# Patient Record
Sex: Female | Born: 1975
Health system: Southern US, Community
[De-identification: ages and names within clinical notes are randomized; demographics above are authoritative.]

## PROBLEM LIST (undated history)

## (undated) DIAGNOSIS — G43909 Migraine, unspecified, not intractable, without status migrainosus: Secondary | ICD-10-CM

## (undated) DIAGNOSIS — E079 Disorder of thyroid, unspecified: Secondary | ICD-10-CM

## (undated) DIAGNOSIS — K219 Gastro-esophageal reflux disease without esophagitis: Secondary | ICD-10-CM

## (undated) DIAGNOSIS — E039 Hypothyroidism, unspecified: Secondary | ICD-10-CM

## (undated) HISTORY — PX: EYE SURGERY: SHX253

## (undated) HISTORY — DX: Disorder of thyroid, unspecified: E07.9

## (undated) HISTORY — PX: BREAST ENHANCEMENT SURGERY: SHX7

## (undated) HISTORY — DX: Migraine, unspecified, not intractable, without status migrainosus: G43.909

## (undated) HISTORY — PX: OTHER SURGICAL HISTORY: SHX169

---

## 2012-09-07 ENCOUNTER — Telehealth: Payer: Self-pay | Admitting: Nurse Practitioner

## 2012-09-07 ENCOUNTER — Telehealth: Payer: Self-pay | Admitting: *Deleted

## 2012-09-07 NOTE — Telephone Encounter (Signed)
PT AWARE that we have no available appts.

## 2012-09-07 NOTE — Telephone Encounter (Signed)
Daughter and husband diagnosed with flu this week.  They are taking Tamiflu.   Patient developed a scratchy throat this morning which is normal for her this time of year.  Denies cough, fever, bodyaches, or congestion. She questioned whether an appt and flu testing were appropriate.   Offered that sometimes we call in Tamiflu prophilactically for close contacts of those diagnosed with the flu.  She doesn't want to take it profphilactically though. Suggested she take an OTC antihistamine and see if her symptoms resolve.  Explained that normally the flu presents suddenly with a cough, bodyaches, and congestion.  She was agreeable to this and will let us know if other symptoms develop.

## 2012-09-08 ENCOUNTER — Encounter: Payer: Self-pay | Admitting: Nurse Practitioner

## 2012-09-08 ENCOUNTER — Ambulatory Visit (INDEPENDENT_AMBULATORY_CARE_PROVIDER_SITE_OTHER): Payer: BC Managed Care – PPO | Admitting: Nurse Practitioner

## 2012-09-08 VITALS — BP 112/70 | HR 96 | Temp 99.9°F | Ht 65.0 in | Wt 153.0 lb

## 2012-09-08 DIAGNOSIS — J069 Acute upper respiratory infection, unspecified: Secondary | ICD-10-CM

## 2012-09-08 DIAGNOSIS — J029 Acute pharyngitis, unspecified: Secondary | ICD-10-CM

## 2012-09-08 DIAGNOSIS — R52 Pain, unspecified: Secondary | ICD-10-CM

## 2012-09-08 LAB — POCT INFLUENZA A/B: Influenza B, POC: NEGATIVE

## 2012-09-08 MED ORDER — HYDROCODONE-HOMATROPINE 5-1.5 MG/5ML PO SYRP: 5.0000 mL | ORAL_SOLUTION | Freq: Three times a day (TID) | ORAL | Status: DC | PRN

## 2012-09-08 NOTE — Patient Instructions (Signed)
Force fluids °Motrin or tylenol OTC °OTC decongestant °Throat lozenges if help °New toothbrush in 3 days ° °

## 2012-09-08 NOTE — Addendum Note (Signed)
Addended by: Bennie Pierini on: 09/08/2012 11:15 AM   Modules accepted: Orders

## 2012-09-08 NOTE — Telephone Encounter (Signed)
appt made

## 2012-09-08 NOTE — Progress Notes (Signed)
  Subjective:    Patient ID: Kimberly Shaw, female    DOB: 1975-12-17, 37 y.o.   MRN: [id number]  HPI-Patient in complaining of Sore throat . Started 1day. Has gotten worse since started. Associated symptoms include slight cough during the night. He has tried nothing OTC. Daughter diagnosed with flu earlier this week.     Review of Systems  Constitutional: Negative.   HENT: Positive for congestion, sore throat, rhinorrhea and sinus pressure. Negative for ear pain.   Eyes: Negative.   Respiratory: Positive for cough (nonproductive).   Cardiovascular: Negative.   Gastrointestinal: Negative.   Genitourinary: Negative.   Musculoskeletal: Negative.   Skin: Negative.   Hematological: Negative.   Psychiatric/Behavioral: Negative.        Objective:   Physical Exam  Constitutional: She appears well-developed and well-nourished.  HENT:  Right Ear: Tympanic membrane is not injected and not erythematous.  Left Ear: Tympanic membrane is not injected and not erythematous.  Nose: Mucosal edema and rhinorrhea present.  Mouth/Throat: Uvula is midline and mucous membranes are normal. Posterior oropharyngeal erythema present. No oropharyngeal exudate or posterior oropharyngeal edema.  Neck: Normal range of motion. Neck supple.  Cardiovascular: Normal rate and normal heart sounds.   Pulmonary/Chest: Effort normal and breath sounds normal.  Abdominal: Soft. Bowel sounds are normal.  Lymphadenopathy:    She has no cervical adenopathy.  Skin: Skin is warm and dry.  Psychiatric: She has a normal mood and affect. Her behavior is normal.    BP 112/70  Pulse 96  Temp(Src) 99.9 F (37.7 C) (Oral)  Ht 5\' 5"  (1.651 m)  Wt 153 lb (69.4 kg)  BMI 25.46 kg/m2  LMP 08/18/2012 Results for orders placed in visit on 09/08/12  POCT RAPID STREP A (OFFICE)      Result Value Range   Rapid Strep A Screen Negative  Negative  POCT INFLUENZA A/B      Result Value Range   Influenza A, POC Negative     Influenza B, POC Negative          Assessment & Plan:  URI/pharyngitis  Force fluids Motrin or tylenol OTC OTC decongestant Throat lozenges if help New toothbrush in 3 days Mary-Margaret Daphine Deutscher, FNP

## 2012-09-12 ENCOUNTER — Telehealth: Payer: Self-pay | Admitting: Nurse Practitioner

## 2012-09-12 NOTE — Telephone Encounter (Signed)
Please advise 

## 2012-09-12 NOTE — Telephone Encounter (Signed)
Patient states that she is still very sick. Seen last week and DWM gave abt this weekend. Please call

## 2012-09-12 NOTE — Telephone Encounter (Signed)
I called in amoxicillin for the weekend She probably needs to be seen again by Gennette Pac

## 2012-09-13 NOTE — Telephone Encounter (Signed)
Continues to have cough and congestion.  She is taking Amoxicillin, sudafed, and a prescription cough med at night.  She was taking Tylenol as well but has d/c it.  She feels a little better since cutting back on the OTC meds.    She will let us know if her symptoms persist or worsen.

## 2013-01-17 ENCOUNTER — Encounter: Payer: Self-pay | Admitting: General Practice

## 2013-01-17 ENCOUNTER — Ambulatory Visit (INDEPENDENT_AMBULATORY_CARE_PROVIDER_SITE_OTHER): Payer: BC Managed Care – PPO | Admitting: General Practice

## 2013-01-17 VITALS — BP 97/66 | HR 69 | Temp 98.5°F | Ht 65.0 in | Wt 152.0 lb

## 2013-01-17 DIAGNOSIS — L239 Allergic contact dermatitis, unspecified cause: Secondary | ICD-10-CM

## 2013-01-17 DIAGNOSIS — L259 Unspecified contact dermatitis, unspecified cause: Secondary | ICD-10-CM

## 2013-01-17 MED ORDER — TRIAMCINOLONE ACETONIDE 0.1 % EX CREA: TOPICAL_CREAM | Freq: Two times a day (BID) | CUTANEOUS | Status: DC

## 2013-01-17 MED ORDER — PREDNISONE (PAK) 10 MG PO TABS: ORAL_TABLET | ORAL | Status: DC

## 2013-01-17 MED ORDER — METHYLPREDNISOLONE ACETATE 80 MG/ML IJ SUSP
80.0000 mg | Freq: Once | INTRAMUSCULAR | Status: AC
  Administered 2013-01-17: 80 mg via INTRAMUSCULAR

## 2013-01-17 NOTE — Progress Notes (Signed)
  Subjective:    Patient ID: Kimberly Shaw, female    DOB: [birth year], 37 y.o.   MRN: [id number]  Rash This is a new problem. The current episode started 1 to 4 weeks ago. The problem has been gradually worsening since onset. The affected locations include the torso, left upper leg and right upper leg. The rash is characterized by redness. She was exposed to nothing. Pertinent negatives include no fever or shortness of breath. Past treatments include antihistamine. There is no history of allergies.      Review of Systems  Constitutional: Negative for fever and chills.  Respiratory: Negative for chest tightness and shortness of breath.   Cardiovascular: Negative for chest pain and palpitations.  Skin: Positive for rash.       Red circular rash to torso, upper legs, and arms  Neurological: Negative for dizziness, weakness and headaches.       Objective:   Physical Exam  Constitutional: She is oriented to person, place, and time. She appears well-developed and well-nourished.  Cardiovascular: Normal rate, regular rhythm and normal heart sounds.   Pulmonary/Chest: Effort normal and breath sounds normal. No respiratory distress. She exhibits no tenderness.  Neurological: She is alert and oriented to person, place, and time.  Skin: Skin is warm and dry. Rash noted. There is erythema.  Maculopapular, erythematous patchy round rash noted to torso, upper legs, and arms.   Psychiatric: She has a normal mood and affect.          Assessment & Plan:  1. Allergic dermatitis - methylPREDNISolone acetate (DEPO-MEDROL) injection 80 mg; Inject 1 mL (80 mg total) into the muscle once. - predniSONE (STERAPRED UNI-PAK) 10 MG tablet; Take as directed  Dispense: 21 tablet; Refill: 0 - triamcinolone cream (KENALOG) 0.1 %; Apply topically 2 (two) times daily.  Dispense: 30 g; Refill: 0 -avoid allergens -keep skin clean and dry -RTO if symptoms worsen or unresolved -Patient verbalized  understanding -Coralie Keens, FNP-C

## 2013-01-17 NOTE — Patient Instructions (Signed)

## 2013-03-30 ENCOUNTER — Other Ambulatory Visit: Payer: Self-pay

## 2014-07-17 ENCOUNTER — Encounter: Payer: Self-pay | Admitting: Nurse Practitioner

## 2014-07-17 ENCOUNTER — Ambulatory Visit (INDEPENDENT_AMBULATORY_CARE_PROVIDER_SITE_OTHER): Payer: BLUE CROSS/BLUE SHIELD | Admitting: Nurse Practitioner

## 2014-07-17 VITALS — BP 104/68 | HR 65 | Temp 97.3°F | Ht 65.0 in | Wt 152.6 lb

## 2014-07-17 DIAGNOSIS — J209 Acute bronchitis, unspecified: Secondary | ICD-10-CM

## 2014-07-17 DIAGNOSIS — J0100 Acute maxillary sinusitis, unspecified: Secondary | ICD-10-CM

## 2014-07-17 MED ORDER — AZITHROMYCIN 250 MG PO TABS: ORAL_TABLET | ORAL | Status: DC

## 2014-07-17 MED ORDER — BENZONATATE 100 MG PO CAPS: 100.0000 mg | ORAL_CAPSULE | Freq: Three times a day (TID) | ORAL | Status: DC | PRN

## 2014-07-17 NOTE — Patient Instructions (Signed)

## 2014-07-17 NOTE — Progress Notes (Signed)
  Subjective:     Kimberly Shaw is a 39 y.o. female who presents for evaluation of sinus pain. Symptoms include: congestion, cough, facial pain, foul breath, headaches, nasal congestion, post nasal drip and sinus pressure. Onset of symptoms was 3 days ago. Symptoms have been unchanged since that time. Past history is significant for no history of pneumonia or bronchitis. Patient is a non-smoker.  The following portions of the patient's history were reviewed and updated as appropriate: allergies, current medications, past family history, past medical history, past social history, past surgical history and problem list.  Review of Systems Pertinent items are noted in HPI.   Objective:    BP 104/68 mmHg  Pulse 65  Temp(Src) 97.3 F (36.3 C) (Oral)  Ht 5\' 5"  (1.651 m)  Wt 152 lb 9.6 oz (69.219 kg)  BMI 25.39 kg/m2  LMP 07/15/2014 General appearance: alert and cooperative Head: Normocephalic, without obvious abnormality, atraumatic Eyes: conjunctivae/corneas clear. PERRL, EOM's intact. Fundi benign. Ears: normal TM's and external ear canals both ears Nose: Nares normal. Septum midline. Mucosa normal. No drainage or sinus tenderness., clear discharge, moderate congestion, sinus tenderness bilateral Throat: lips, mucosa, and tongue normal; teeth and gums normal Neck: no adenopathy, no carotid bruit, no JVD, supple, symmetrical, trachea midline and thyroid not enlarged, symmetric, no tenderness/mass/nodules Lungs: normal percussion bilaterally and dry hacky cough Heart: regular rate and rhythm, S1, S2 normal, no murmur, click, rub or gallop    Assessment:    Acute bacterial sinusitis.    Bronchitis    Plan:   1. Take meds as prescribed 2. Use a cool mist humidifier especially during the winter months and when heat has been humid. 3. Use saline nose sprays frequently 4. Saline irrigations of the nose can be very helpful if done frequently.  * 4X daily for 1 week*  * Use of a nettie  pot can be helpful with this. Follow directions with this* 5. Drink plenty of fluids 6. Keep thermostat turn down low 7.For any cough or congestion  Use plain Mucinex- regular strength or max strength is fine   * Children- consult with Pharmacist for dosing 8. For fever or aces or pains- take tylenol or ibuprofen appropriate for age and weight.  * for fevers greater than 101 orally you may alternate ibuprofen and tylenol every  3 hours.    Meds ordered this encounter  Medications  . cyanocobalamin 500 MCG tablet    Sig: Take 500 mcg by mouth daily.  Marland Kitchen. azithromycin (ZITHROMAX Z-PAK) 250 MG tablet    Sig: As directed    Dispense:  1 each    Refill:  0    Order Specific Question:  Supervising Provider    Answer:  Ernestina PennaMOORE, DONALD W [[id number]]  . benzonatate (TESSALON PERLES) 100 MG capsule    Sig: Take 1 capsule (100 mg total) by mouth 3 (three) times daily as needed for cough.    Dispense:  20 capsule    Refill:  0    Order Specific Question:  Supervising Provider    Answer:  Ernestina PennaMOORE, DONALD W [[id number]]    Mary-Margaret Daphine DeutscherMartin, FNP

## 2014-07-18 ENCOUNTER — Telehealth: Payer: Self-pay | Admitting: Nurse Practitioner

## 2014-07-18 MED ORDER — HYDROCODONE-HOMATROPINE 5-1.5 MG/5ML PO SYRP: 5.0000 mL | ORAL_SOLUTION | Freq: Four times a day (QID) | ORAL | Status: DC | PRN

## 2014-07-18 NOTE — Telephone Encounter (Signed)
Hycodan rx ready for pick up  

## 2014-07-18 NOTE — Telephone Encounter (Signed)
Patient aware rx ready and at the front desk

## 2014-10-17 ENCOUNTER — Telehealth: Payer: Self-pay | Admitting: Nurse Practitioner

## 2014-10-17 NOTE — Telephone Encounter (Signed)
Appointment given for Friday at 11:25 with Jannifer Rodneyhristy Hawks, FNP.

## 2014-10-19 ENCOUNTER — Encounter: Payer: Self-pay | Admitting: Family

## 2014-10-19 ENCOUNTER — Ambulatory Visit (INDEPENDENT_AMBULATORY_CARE_PROVIDER_SITE_OTHER): Payer: BLUE CROSS/BLUE SHIELD | Admitting: Family

## 2014-10-19 VITALS — BP 101/74 | HR 58 | Temp 97.1°F | Ht 65.0 in | Wt 153.0 lb

## 2014-10-19 DIAGNOSIS — M5432 Sciatica, left side: Secondary | ICD-10-CM | POA: Diagnosis not present

## 2014-10-19 MED ORDER — MELOXICAM 15 MG PO TABS: 15.0000 mg | ORAL_TABLET | Freq: Every day | ORAL | Status: DC

## 2014-10-19 MED ORDER — METHYLPREDNISOLONE ACETATE 80 MG/ML IJ SUSP
80.0000 mg | Freq: Once | INTRAMUSCULAR | Status: AC
  Administered 2014-10-19: 80 mg via INTRAMUSCULAR

## 2014-10-19 MED ORDER — CYCLOBENZAPRINE HCL 10 MG PO TABS: 10.0000 mg | ORAL_TABLET | Freq: Three times a day (TID) | ORAL | Status: DC | PRN

## 2014-10-19 MED ORDER — KETOROLAC TROMETHAMINE 60 MG/2ML IM SOLN
60.0000 mg | Freq: Once | INTRAMUSCULAR | Status: AC
  Administered 2014-10-19: 60 mg via INTRAMUSCULAR

## 2014-10-19 NOTE — Patient Instructions (Signed)
Sciatica Sciatica is pain, weakness, numbness, or tingling along the path of the sciatic nerve. The nerve starts in the lower back and runs down the back of each leg. The nerve controls the muscles in the lower leg and in the back of the knee, while also providing sensation to the back of the thigh, lower leg, and the sole of your foot. Sciatica is a symptom of another medical condition. For instance, nerve damage or certain conditions, such as a herniated disk or bone spur on the spine, pinch or put pressure on the sciatic nerve. This causes the pain, weakness, or other sensations normally associated with sciatica. Generally, sciatica only affects one side of the body. CAUSES   Herniated or slipped disc.  Degenerative disk disease.  A pain disorder involving the narrow muscle in the buttocks (piriformis syndrome).  Pelvic injury or fracture.  Pregnancy.  Tumor (rare). SYMPTOMS  Symptoms can vary from mild to very severe. The symptoms usually travel from the low back to the buttocks and down the back of the leg. Symptoms can include:  Mild tingling or dull aches in the lower back, leg, or hip.  Numbness in the back of the calf or sole of the foot.  Burning sensations in the lower back, leg, or hip.  Sharp pains in the lower back, leg, or hip.  Leg weakness.  Severe back pain inhibiting movement. These symptoms may get worse with coughing, sneezing, laughing, or prolonged sitting or standing. Also, being overweight may worsen symptoms. DIAGNOSIS  Your caregiver will perform a physical exam to look for common symptoms of sciatica. He or she may ask you to do certain movements or activities that would trigger sciatic nerve pain. Other tests may be performed to find the cause of the sciatica. These may include:  Blood tests.  X-rays.  Imaging tests, such as an MRI or CT scan. TREATMENT  Treatment is directed at the cause of the sciatic pain. Sometimes, treatment is not necessary  and the pain and discomfort goes away on its own. If treatment is needed, your caregiver may suggest:  Over-the-counter medicines to relieve pain.  Prescription medicines, such as anti-inflammatory medicine, muscle relaxants, or narcotics.  Applying heat or ice to the painful area.  Steroid injections to lessen pain, irritation, and inflammation around the nerve.  Reducing activity during periods of pain.  Exercising and stretching to strengthen your abdomen and improve flexibility of your spine. Your caregiver may suggest losing weight if the extra weight makes the back pain worse.  Physical therapy.  Surgery to eliminate what is pressing or pinching the nerve, such as a bone spur or part of a herniated disk. HOME CARE INSTRUCTIONS   Only take over-the-counter or prescription medicines for pain or discomfort as directed by your caregiver.  Apply ice to the affected area for 20 minutes, 3-4 times a day for the first 48-72 hours. Then try heat in the same way.  Exercise, stretch, or perform your usual activities if these do not aggravate your pain.  Attend physical therapy sessions as directed by your caregiver.  Keep all follow-up appointments as directed by your caregiver.  Do not wear high heels or shoes that do not provide proper support.  Check your mattress to see if it is too soft. A firm mattress may lessen your pain and discomfort. SEEK IMMEDIATE MEDICAL CARE IF:   You lose control of your bowel or bladder (incontinence).  You have increasing weakness in the lower back, pelvis, buttocks,   or legs.  You have redness or swelling of your back.  You have a burning sensation when you urinate.  You have pain that gets worse when you lie down or awakens you at night.  Your pain is worse than you have experienced in the past.  Your pain is lasting longer than 4 weeks.  You are suddenly losing weight without reason. MAKE SURE YOU:  Understand these  instructions.  Will watch your condition.  Will get help right away if you are not doing well or get worse. Document Released: 05/05/2001 Document Revised: 11/10/2011 Document Reviewed: 09/20/2011 Exit[employer] Patient Information 2015 [employer]. This information is not intended to replace advice given to you by your health care provider. Make sure you discuss any questions you have with your health care provider.  

## 2014-10-19 NOTE — Addendum Note (Signed)
Addended by: Jannifer RodneyHAWKS, CHRISTY A on: 10/19/2014 12:11 PM   Modules accepted: Orders

## 2014-10-19 NOTE — Progress Notes (Addendum)
Subjective:    Patient ID: Kimberly Shaw, female    DOB: 06-16-1975, 39 y.o.   MRN: [id number]  Back Pain This is a new problem. The current episode started more than 1 month ago. The problem occurs intermittently. The problem has been gradually worsening since onset. The pain is present in the gluteal. The quality of the pain is described as burning. The pain radiates to the left foot, left knee and left thigh. The pain is at a severity of 6/10. The pain is moderate. The symptoms are aggravated by bending and sitting. Associated symptoms include leg pain, numbness and tingling. Pertinent negatives include no bladder incontinence, bowel incontinence, dysuria, headaches or weakness. She has tried NSAIDs for the symptoms. The treatment provided mild relief.      Review of Systems  Constitutional: Negative.   HENT: Negative.   Eyes: Negative.   Respiratory: Negative.  Negative for shortness of breath.   Cardiovascular: Negative.  Negative for palpitations.  Gastrointestinal: Negative.  Negative for bowel incontinence.  Endocrine: Negative.   Genitourinary: Negative.  Negative for bladder incontinence and dysuria.  Musculoskeletal: Positive for back pain.  Neurological: Positive for tingling and numbness. Negative for weakness and headaches.  Hematological: Negative.   Psychiatric/Behavioral: Negative.   All other systems reviewed and are negative.      Objective:   Physical Exam  Constitutional: She is oriented to person, place, and time. She appears well-developed and well-nourished. No distress.  HENT:  Head: Normocephalic and atraumatic.  Right Ear: External ear normal.  Left Ear: External ear normal.  Nose: Nose normal.  Mouth/Throat: Oropharynx is clear and moist.  Eyes: Pupils are equal, round, and reactive to light.  Neck: Normal range of motion. Neck supple. No thyromegaly present.  Cardiovascular: Normal rate, regular rhythm, normal heart sounds and intact distal pulses.     No murmur heard. Pulmonary/Chest: Effort normal and breath sounds normal. No respiratory distress. She has no wheezes.  Abdominal: Soft. Bowel sounds are normal. She exhibits no distension. There is no tenderness.  Musculoskeletal: Normal range of motion. She exhibits no edema or tenderness.  Neurological: She is alert and oriented to person, place, and time. She has normal reflexes. No cranial nerve deficit.  Skin: Skin is warm and dry.  Psychiatric: She has a normal mood and affect. Her behavior is normal. Judgment and thought content normal.  Vitals reviewed.     BP 101/74 mmHg  Pulse 58  Temp(Src) 97.1 F (36.2 C) (Oral)  Ht 5\' 5"  (1.651 m)  Wt 153 lb (69.4 kg)  BMI 25.46 kg/m2     Assessment & Plan:  1. Left sciatic nerve pain -Rest -Ice -Sedation precaution discussed -No other NSAID"s while taking Mobic -RTO prn  -BMP - ketorolac (TORADOL) injection 60 mg; Inject 2 mLs (60 mg total) into the muscle once. - methylPREDNISolone acetate (DEPO-MEDROL) injection 80 mg; Inject 1 mL (80 mg total) into the muscle once. - meloxicam (MOBIC) 15 MG tablet; Take 1 tablet (15 mg total) by mouth daily.  Dispense: 30 tablet; Refill: 0 - cyclobenzaprine (FLEXERIL) 10 MG tablet; Take 1 tablet (10 mg total) by mouth 3 (three) times daily as needed for muscle spasms.  Dispense: 30 tablet; Refill: 0  Jannifer Rodney, FNP

## 2015-05-22 ENCOUNTER — Encounter: Payer: Self-pay | Admitting: Pediatrics

## 2015-05-22 ENCOUNTER — Ambulatory Visit (INDEPENDENT_AMBULATORY_CARE_PROVIDER_SITE_OTHER): Payer: BLUE CROSS/BLUE SHIELD | Admitting: Pediatrics

## 2015-05-22 VITALS — BP 96/72 | HR 58 | Temp 97.1°F | Ht 65.0 in | Wt 155.4 lb

## 2015-05-22 DIAGNOSIS — E039 Hypothyroidism, unspecified: Secondary | ICD-10-CM

## 2015-05-22 DIAGNOSIS — M5432 Sciatica, left side: Secondary | ICD-10-CM | POA: Insufficient documentation

## 2015-05-22 MED ORDER — MELOXICAM 15 MG PO TABS: 15.0000 mg | ORAL_TABLET | Freq: Every day | ORAL | Status: DC

## 2015-05-22 MED ORDER — PREDNISONE 10 MG PO TABS: ORAL_TABLET | ORAL | Status: DC

## 2015-05-22 NOTE — Progress Notes (Addendum)
    Subjective:    Patient ID: Kimberly Shaw, female    DOB: 11/21/1975, 39 y.o.   MRN: [id number]  CC: Sciatica   HPI: Kimberly Shaw is a 39 y.o. female presenting for Sciatica  Now waking up at night with L sciatic pain, feels like in lower back or L butt cheek, sometimes L foot will feel cold and numb Was treated with toradol, steroids, meloxicam Didn't take much flexeril as didn't help Pain worse with sitting for long periods of time. 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 Interim medical history since our last visit reviewed. Allergies and medications reviewed and updated.    ROS: Per HPI unless specifically indicated above  History  Smoking status  . Never Smoker   Smokeless tobacco  . Not on file    Past Medical History Patient Active Problem List   Diagnosis Date Noted  . Left sciatic nerve pain 05/22/2015  . Hypothyroidism 05/22/2015       Objective:    BP 96/72 mmHg  Pulse 58  Temp(Src) 97.1 F (36.2 C) (Oral)  Ht 5\' 5"  (1.651 m)  Wt 155 lb 6.4 oz (70.489 kg)  BMI 25.86 kg/m2  Wt Readings from Last 3 Encounters:  05/22/15 155 lb 6.4 oz (70.489 kg)  10/19/14 153 lb (69.4 kg)  07/17/14 152 lb 9.6 oz (69.219 kg)     Gen: NAD, alert, cooperative with exam, NCAT EYES: EOMI, no scleral injection or icterus ENT:  OP without erythema LYMPH: no cervical LAD CV: NRRR, normal S1/S2, no murmur, distal pulses 2+ b/l Resp: CTABL, no wheezes, normal WOB Abd: +BS, soft, NTND. no guarding or organomegaly Ext: No edema, warm Neuro: Alert and oriented, strength equal b/l LE, coordination grossly normal, normal patellar reflexes b/l. Sensation intact LE. MSK: SLR normal b/l, no point tenderness over spine.     Assessment & Plan:      Kimberly Shaw was seen today for sciatica. Will treat with steroid taper, NSAIDs. Discussed natural history of disease, plan for NSAIDs with future exacerbations, want to avoid steroids when we can. Consider PT in future. Gave gentle back exercises. Pt voiced understanding, questions answered. Return precautions given. Followed by other doctor for hypothyroidism, primary care, pap smears.  Diagnoses and all orders for this visit:  Left sciatic nerve pain -     predniSONE (DELTASONE) 10 MG tablet; TAKE 4 TABS DAILY X2DAYS, 3 TABS DAILY X2DAYS, 2 TABS DAILY X2DAYS, THEN 1 TABLET DAILY FOR 2 DAYS WITH FOOD -     meloxicam (MOBIC) 15 MG tablet; Take 1 tablet (15 mg total) by mouth daily.   Follow up plan: Return if symptoms worsen or fail to improve.  Rex Krasarol Vincent, MD [facility] 05/22/2015, 3:04 PM

## 2015-05-22 NOTE — Patient Instructions (Addendum)
Sciatica With Rehab The sciatic nerve runs from the back down the leg and is responsible for sensation and control of the muscles in the back (posterior) side of the thigh, lower leg, and foot. Sciatica is a condition that is characterized by inflammation of this nerve.  SYMPTOMS   Signs of nerve damage, including numbness and/or weakness along the posterior side of the lower extremity.  Pain in the back of the thigh that may also travel down the leg.  Pain that worsens when sitting for long periods of time.  Occasionally, pain in the back or buttock. CAUSES  Inflammation of the sciatic nerve is the cause of sciatica. The inflammation is due to something irritating the nerve. Common sources of irritation include:  Sitting for long periods of time.  Direct trauma to the nerve.  Arthritis of the spine.  Herniated or ruptured disk.  Slipping of the vertebrae (spondylolisthesis).  Pressure from soft tissues, such as muscles or ligament-like tissue (fascia). RISK INCREASES WITH:  Sports that place pressure or stress on the spine (football or weightlifting).  Poor strength and flexibility.  Failure to warm up properly before activity.  Family history of low back pain or disk disorders.  Previous back injury or surgery.  Poor body mechanics, especially when lifting, or poor posture. PREVENTION   Warm up and stretch properly before activity.  Maintain physical fitness:  Strength, flexibility, and endurance.  Cardiovascular fitness.  Learn and use proper technique, especially with posture and lifting. When possible, have coach correct improper technique.  Avoid activities that place stress on the spine. PROGNOSIS If treated properly, then sciatica usually resolves within 6 weeks. However, occasionally surgery is necessary.  RELATED COMPLICATIONS   Permanent nerve damage, including pain, numbness, tingle, or weakness.  Chronic back pain.  Risks of surgery: infection,  bleeding, nerve damage, or damage to surrounding tissues. TREATMENT Treatment initially involves resting from any activities that aggravate your symptoms. The use of ice and medication may help reduce pain and inflammation. The use of strengthening and stretching exercises may help reduce pain with activity. These exercises may be performed at home or with referral to a therapist. A therapist may recommend further treatments, such as transcutaneous electronic nerve stimulation (TENS) or ultrasound. Your caregiver may recommend corticosteroid injections to help reduce inflammation of the sciatic nerve. If symptoms persist despite non-surgical (conservative) treatment, then surgery may be recommended. MEDICATION  If pain medication is necessary, then nonsteroidal anti-inflammatory medications, such as aspirin and ibuprofen, or other minor pain relievers, such as acetaminophen, are often recommended.  Do not take pain medication for 7 days before surgery.  Prescription pain relievers may be given if deemed necessary by your caregiver. Use only as directed and only as much as you need.  Ointments applied to the skin may be helpful.  Corticosteroid injections may be given by your caregiver. These injections should be reserved for the most serious cases, because they may only be given a certain number of times. HEAT AND COLD  Cold treatment (icing) relieves pain and reduces inflammation. Cold treatment should be applied for 10 to 15 minutes every 2 to 3 hours for inflammation and pain and immediately after any activity that aggravates your symptoms. Use ice packs or massage the area with a piece of ice (ice massage).  Heat treatment may be used prior to performing the stretching and strengthening activities prescribed by your caregiver, physical therapist, or athletic trainer. Use a heat pack or soak the injury in warm water.   SEEK MEDICAL CARE IF:  Treatment seems to offer no benefit, or the condition  worsens.  Any medications produce adverse side effects. EXERCISES  RANGE OF MOTION (ROM) AND STRETCHING EXERCISES - Sciatica Most people with sciatic will find that their symptoms worsen with either excessive bending forward (flexion) or arching at the low back (extension). The exercises which will help resolve your symptoms will focus on the opposite motion. Your physician, physical therapist or athletic trainer will help you determine which exercises will be most helpful to resolve your low back pain. Do not complete any exercises without first consulting with your clinician. Discontinue any exercises which worsen your symptoms until you speak to your clinician. If you have pain, numbness or tingling which travels down into your buttocks, leg or foot, the goal of the therapy is for these symptoms to move closer to your back and eventually resolve. Occasionally, these leg symptoms will get better, but your low back pain may worsen; this is typically an indication of progress in your rehabilitation. Be certain to be very alert to any changes in your symptoms and the activities in which you participated in the 24 hours prior to the change. Sharing this information with your clinician will allow him/her to most efficiently treat your condition. These exercises may help you when beginning to rehabilitate your injury. Your symptoms may resolve with or without further involvement from your physician, physical therapist or athletic trainer. While completing these exercises, remember:   Restoring tissue flexibility helps normal motion to return to the joints. This allows healthier, less painful movement and activity.  An effective stretch should be held for at least 30 seconds.  A stretch should never be painful. You should only feel a gentle lengthening or release in the stretched tissue. FLEXION RANGE OF MOTION AND STRETCHING EXERCISES: STRETCH - Flexion, Single Knee to Chest   Lie on a firm bed or floor  with both legs extended in front of you.  Keeping one leg in contact with the floor, bring your opposite knee to your chest. Hold your leg in place by either grabbing behind your thigh or at your knee.  Pull until you feel a gentle stretch in your low back. Hold __________ seconds.  Slowly release your grasp and repeat the exercise with the opposite side. Repeat __________ times. Complete this exercise __________ times per day.  STRETCH - Flexion, Double Knee to Chest  Lie on a firm bed or floor with both legs extended in front of you.  Keeping one leg in contact with the floor, bring your opposite knee to your chest.  Tense your stomach muscles to support your back and then lift your other knee to your chest. Hold your legs in place by either grabbing behind your thighs or at your knees.  Pull both knees toward your chest until you feel a gentle stretch in your low back. Hold __________ seconds.  Tense your stomach muscles and slowly return one leg at a time to the floor. Repeat __________ times. Complete this exercise __________ times per day.  STRETCH - Low Trunk Rotation   Lie on a firm bed or floor. Keeping your legs in front of you, bend your knees so they are both pointed toward the ceiling and your feet are flat on the floor.  Extend your arms out to the side. This will stabilize your upper body by keeping your shoulders in contact with the floor.  Gently and slowly drop both knees together to one side until   you feel a gentle stretch in your low back. Hold for __________ seconds.  Tense your stomach muscles to support your low back as you bring your knees back to the starting position. Repeat the exercise to the other side. Repeat __________ times. Complete this exercise __________ times per day  EXTENSION RANGE OF MOTION AND FLEXIBILITY EXERCISES: STRETCH - Extension, Prone on Elbows  Lie on your stomach on the floor, a bed will be too soft. Place your palms about shoulder  width apart and at the height of your head.  Place your elbows under your shoulders. If this is too painful, stack pillows under your chest.  Allow your body to relax so that your hips drop lower and make contact more completely with the floor.  Hold this position for __________ seconds.  Slowly return to lying flat on the floor. Repeat __________ times. Complete this exercise __________ times per day.  RANGE OF MOTION - Extension, Prone Press Ups  Lie on your stomach on the floor, a bed will be too soft. Place your palms about shoulder width apart and at the height of your head.  Keeping your back as relaxed as possible, slowly straighten your elbows while keeping your hips on the floor. You may adjust the placement of your hands to maximize your comfort. As you gain motion, your hands will come more underneath your shoulders.  Hold this position __________ seconds.  Slowly return to lying flat on the floor. Repeat __________ times. Complete this exercise __________ times per day.  STRENGTHENING EXERCISES - Sciatica  These exercises may help you when beginning to rehabilitate your injury. These exercises should be done near your "sweet spot." This is the neutral, low-back arch, somewhere between fully rounded and fully arched, that is your least painful position. When performed in this safe range of motion, these exercises can be used for people who have either a flexion or extension based injury. These exercises may resolve your symptoms with or without further involvement from your physician, physical therapist or athletic trainer. While completing these exercises, remember:   Muscles can gain both the endurance and the strength needed for everyday activities through controlled exercises.  Complete these exercises as instructed by your physician, physical therapist or athletic trainer. Progress with the resistance and repetition exercises only as your caregiver advises.  You may  experience muscle soreness or fatigue, but the pain or discomfort you are trying to eliminate should never worsen during these exercises. If this pain does worsen, stop and make certain you are following the directions exactly. If the pain is still present after adjustments, discontinue the exercise until you can discuss the trouble with your clinician. STRENGTHENING - Deep Abdominals, Pelvic Tilt   Lie on a firm bed or floor. Keeping your legs in front of you, bend your knees so they are both pointed toward the ceiling and your feet are flat on the floor.  Tense your lower abdominal muscles to press your low back into the floor. This motion will rotate your pelvis so that your tail bone is scooping upwards rather than pointing at your feet or into the floor.  With a gentle tension and even breathing, hold this position for __________ seconds. Repeat __________ times. Complete this exercise __________ times per day.  STRENGTHENING - Abdominals, Crunches   Lie on a firm bed or floor. Keeping your legs in front of you, bend your knees so they are both pointed toward the ceiling and your feet are flat on the   floor. Cross your arms over your chest.  Slightly tip your chin down without bending your neck.  Tense your abdominals and slowly lift your trunk high enough to just clear your shoulder blades. Lifting higher can put excessive stress on the low back and does not further strengthen your abdominal muscles.  Control your return to the starting position. Repeat __________ times. Complete this exercise __________ times per day.  STRENGTHENING - Quadruped, Opposite UE/LE Lift  Assume a hands and knees position on a firm surface. Keep your hands under your shoulders and your knees under your hips. You may place padding under your knees for comfort.  Find your neutral spine and gently tense your abdominal muscles so that you can maintain this position. Your shoulders and hips should form a rectangle  that is parallel with the floor and is not twisted.  Keeping your trunk steady, lift your right hand no higher than your shoulder and then your left leg no higher than your hip. Make sure you are not holding your breath. Hold this position __________ seconds.  Continuing to keep your abdominal muscles tense and your back steady, slowly return to your starting position. Repeat with the opposite arm and leg. Repeat __________ times. Complete this exercise __________ times per day.  STRENGTHENING - Abdominals and Quadriceps, Straight Leg Raise   Lie on a firm bed or floor with both legs extended in front of you.  Keeping one leg in contact with the floor, bend the other knee so that your foot can rest flat on the floor.  Find your neutral spine, and tense your abdominal muscles to maintain your spinal position throughout the exercise.  Slowly lift your straight leg off the floor about 6 inches for a count of 15, making sure to not hold your breath.  Still keeping your neutral spine, slowly lower your leg all the way to the floor. Repeat this exercise with each leg __________ times. Complete this exercise __________ times per day. POSTURE AND BODY MECHANICS CONSIDERATIONS - Sciatica Keeping correct posture when sitting, standing or completing your activities will reduce the stress put on different body tissues, allowing injured tissues a chance to heal and limiting painful experiences. The following are general guidelines for improved posture. Your physician or physical therapist will provide you with any instructions specific to your needs. While reading these guidelines, remember:  The exercises prescribed by your provider will help you have the flexibility and strength to maintain correct postures.  The correct posture provides the optimal environment for your joints to work. All of your joints have less wear and tear when properly supported by a spine with good posture. This means you will  experience a healthier, less painful body.  Correct posture must be practiced with all of your activities, especially prolonged sitting and standing. Correct posture is as important when doing repetitive low-stress activities (typing) as it is when doing a single heavy-load activity (lifting). RESTING POSITIONS Consider which positions are most painful for you when choosing a resting position. If you have pain with flexion-based activities (sitting, bending, stooping, squatting), choose a position that allows you to rest in a less flexed posture. You would want to avoid curling into a fetal position on your side. If your pain worsens with extension-based activities (prolonged standing, working overhead), avoid resting in an extended position such as sleeping on your stomach. Most people will find more comfort when they rest with their spine in a more neutral position, neither too rounded nor too   arched. Lying on a non-sagging bed on your side with a pillow between your knees, or on your back with a pillow under your knees will often provide some relief. Keep in mind, being in any one position for a prolonged period of time, no matter how correct your posture, can still lead to stiffness. PROPER SITTING POSTURE In order to minimize stress and discomfort on your spine, you must sit with correct posture Sitting with good posture should be effortless for a healthy body. Returning to good posture is a gradual process. Many people can work toward this most comfortably by using various supports until they have the flexibility and strength to maintain this posture on their own. When sitting with proper posture, your ears will fall over your shoulders and your shoulders will fall over your hips. You should use the back of the chair to support your upper back. Your low back will be in a neutral position, just slightly arched. You may place a small pillow or folded towel at the base of your low back for support.  When  working at a desk, create an environment that supports good, upright posture. Without extra support, muscles fatigue and lead to excessive strain on joints and other tissues. Keep these recommendations in mind: CHAIR:   A chair should be able to slide under your desk when your back makes contact with the back of the chair. This allows you to work closely.  The chair's height should allow your eyes to be level with the upper part of your monitor and your hands to be slightly lower than your elbows. BODY POSITION  Your feet should make contact with the floor. If this is not possible, use a foot rest.  Keep your ears over your shoulders. This will reduce stress on your neck and low back. INCORRECT SITTING POSTURES   If you are feeling tired and unable to assume a healthy sitting posture, do not slouch or slump. This puts excessive strain on your back tissues, causing more damage and pain. Healthier options include:  Using more support, like a lumbar pillow.  Switching tasks to something that requires you to be upright or walking.  Talking a brief walk.  Lying down to rest in a neutral-spine position. PROLONGED STANDING WHILE SLIGHTLY LEANING FORWARD  When completing a task that requires you to lean forward while standing in one place for a long time, place either foot up on a stationary 2-4 inch high object to help maintain the best posture. When both feet are on the ground, the low back tends to lose its slight inward curve. If this curve flattens (or becomes too large), then the back and your other joints will experience too much stress, fatigue more quickly and can cause pain.  CORRECT STANDING POSTURES Proper standing posture should be assumed with all daily activities, even if they only take a few moments, like when brushing your teeth. As in sitting, your ears should fall over your shoulders and your shoulders should fall over your hips. You should keep a slight tension in your abdominal  muscles to brace your spine. Your tailbone should point down to the ground, not behind your body, resulting in an over-extended swayback posture.  INCORRECT STANDING POSTURES  Common incorrect standing postures include a forward head, locked knees and/or an excessive swayback. WALKING Walk with an upright posture. Your ears, shoulders and hips should all line-up. PROLONGED ACTIVITY IN A FLEXED POSITION When completing a task that requires you to bend forward   at your waist or lean over a low surface, try to find a way to stabilize 3 of 4 of your limbs. You can place a hand or elbow on your thigh or rest a knee on the surface you are reaching across. This will provide you more stability so that your muscles do not fatigue as quickly. By keeping your knees relaxed, or slightly bent, you will also reduce stress across your low back. CORRECT LIFTING TECHNIQUES DO :   Assume a wide stance. This will provide you more stability and the opportunity to get as close as possible to the object which you are lifting.  Tense your abdominals to brace your spine; then bend at the knees and hips. Keeping your back locked in a neutral-spine position, lift using your leg muscles. Lift with your legs, keeping your back straight.  Test the weight of unknown objects before attempting to lift them.  Try to keep your elbows locked down at your sides in order get the best strength from your shoulders when carrying an object.  Always ask for help when lifting heavy or awkward objects. INCORRECT LIFTING TECHNIQUES DO NOT:   Lock your knees when lifting, even if it is a small object.  Bend and twist. Pivot at your feet or move your feet when needing to change directions.  Assume that you cannot safely pick up a paperclip without proper posture.   This information is not intended to replace advice given to you by your health care provider. Make sure you discuss any questions you have with your health care provider.     Document Released: 05/11/2005 Document Revised: 09/25/2014 Document Reviewed: 08/23/2008 [employer] Patient Education 2016 [employer]  

## 2015-09-09 ENCOUNTER — Encounter: Payer: Self-pay | Admitting: Family Medicine

## 2015-09-09 ENCOUNTER — Ambulatory Visit (INDEPENDENT_AMBULATORY_CARE_PROVIDER_SITE_OTHER): Payer: BLUE CROSS/BLUE SHIELD

## 2015-09-09 ENCOUNTER — Ambulatory Visit (INDEPENDENT_AMBULATORY_CARE_PROVIDER_SITE_OTHER): Payer: BLUE CROSS/BLUE SHIELD | Admitting: Family Medicine

## 2015-09-09 VITALS — BP 98/68 | HR 64 | Temp 97.7°F | Ht 65.0 in | Wt 154.0 lb

## 2015-09-09 DIAGNOSIS — E039 Hypothyroidism, unspecified: Secondary | ICD-10-CM | POA: Diagnosis not present

## 2015-09-09 DIAGNOSIS — Z131 Encounter for screening for diabetes mellitus: Secondary | ICD-10-CM

## 2015-09-09 DIAGNOSIS — M5432 Sciatica, left side: Secondary | ICD-10-CM

## 2015-09-09 DIAGNOSIS — Z13220 Encounter for screening for lipoid disorders: Secondary | ICD-10-CM | POA: Diagnosis not present

## 2015-09-09 DIAGNOSIS — Z114 Encounter for screening for human immunodeficiency virus [HIV]: Secondary | ICD-10-CM

## 2015-09-09 MED ORDER — PREDNISONE 20 MG PO TABS: 40.0000 mg | ORAL_TABLET | Freq: Every day | ORAL | Status: DC

## 2015-09-09 NOTE — Progress Notes (Signed)
BP 98/68 mmHg  Pulse 64  Temp(Src) 97.7 F (36.5 C) (Oral)  Ht _0  (1.651 m)  Wt 154 lb (69.854 kg)  BMI 25.63 kg/m2  LMP 08/17/2015 (Approximate)   Subjective:    Patient ID: Kimberly Shaw, female    DOB: 07/12/1975, 40 y.o.   MRN: [id number]  HPI: Kimberly Shaw is a 40 y.o. female presenting on 09/09/2015 for Labwork and Back Pain   HPI Low back pain with sciatic pain Patient is been having left lower back pain that radiated down into her left hamstring and thigh. She also says sometimes she gets numbness on the lateral aspect of her left thigh as well. The low back pain is been something that's been going on intermittently over the past year. It started flaring up again 2 days ago and that is what brings her in today. She's never had any imaging of her back and would like to see inferior with going on. She denies any fevers or chills or overlying skin changes.  Relevant past medical, surgical, family and social history reviewed and updated as indicated. Interim medical history since our last visit reviewed. Allergies and medications reviewed and updated.  Review of Systems  Constitutional: Negative for fever and chills.  HENT: Negative for congestion, ear discharge and ear pain.   Eyes: Negative for redness and visual disturbance.  Respiratory: Negative for chest tightness and shortness of breath.   Cardiovascular: Negative for chest pain and leg swelling.  Genitourinary: Negative for dysuria and difficulty urinating.  Musculoskeletal: Positive for back pain. Negative for gait problem.  Skin: Negative for rash.  Neurological: Negative for dizziness, seizures, weakness, light-headedness, numbness and headaches.  Psychiatric/Behavioral: Negative for behavioral problems and agitation.  All other systems reviewed and are negative.   Per HPI unless specifically indicated above     Medication List       This list is accurate as of: 09/09/15 11:15 AM.  Always use your most  recent med list.               levothyroxine 75 MCG tablet  Commonly known as:  SYNTHROID, LEVOTHROID  Take 75 mcg by mouth daily before breakfast.     meloxicam 15 MG tablet  Commonly known as:  MOBIC  Take 1 tablet (15 mg total) by mouth daily.           Objective:    BP 98/68 mmHg  Pulse 64  Temp(Src) 97.7 F (36.5 C) (Oral)  Ht _1  (1.651 m)  Wt 154 lb (69.854 kg)  BMI 25.63 kg/m2  LMP 08/17/2015 (Approximate)  Wt Readings from Last 3 Encounters:  09/09/15 154 lb (69.854 kg)  05/22/15 155 lb 6.4 oz (70.489 kg)  10/19/14 153 lb (69.4 kg)    Physical Exam  Constitutional: She is oriented to person, place, and time. She appears well-developed and well-nourished. No distress.  Eyes: Conjunctivae and EOM are normal. Pupils are equal, round, and reactive to light.  Neck: Neck supple. No thyromegaly present.  Cardiovascular: Normal rate, regular rhythm, normal heart sounds and intact distal pulses.   No murmur heard. Pulmonary/Chest: Effort normal and breath sounds normal. No respiratory distress. She has no wheezes.  Musculoskeletal: Normal range of motion. She exhibits no edema.       Lumbar back: She exhibits tenderness (negative SLR). She exhibits normal range of motion, no bony tenderness, no swelling and no deformity.       Back:  Lymphadenopathy:    She has  no cervical adenopathy.  Neurological: She is alert and oriented to person, place, and time. Coordination normal.  Skin: Skin is warm and dry. No rash noted. She is not diaphoretic.  Psychiatric: She has a normal mood and affect. Her behavior is normal.  Nursing note and vitals reviewed.  Lumbar x-ray: Mild compression through the superior endplate of L3 felt to be chronic. No definite acute process    Assessment & Plan:       Problem List Items Addressed This Visit      Endocrine   Hypothyroidism - Primary   Relevant Orders   CBC with Differential/Platelet   TSH     Nervous and Auditory    Left sciatic nerve pain   Relevant Orders   DG Lumbar Spine 2-3 Views   Ambulatory referral to Physical Therapy    Other Visit Diagnoses    Screening for diabetes mellitus        Relevant Orders    CMP14+EGFR    Screening for lipid disorders        Relevant Orders    Lipid panel    Screening for HIV without presence of risk factors        Relevant Orders    HIV antibody        Follow up plan: Return in about 6 months (around 03/10/2016), or if symptoms worsen or fail to improve, for Recheck thyroid.  Counseling provided for all of the vaccine components Orders Placed This Encounter  Procedures  . DG Lumbar Spine 2-3 Views  . CBC with Differential/Platelet  . TSH  . Lipid panel  . CMP14+EGFR  . HIV antibody  . Ambulatory referral to Physical Therapy    Caryl Pina, MD [facility] 09/09/2015, 11:15 AM

## 2015-09-10 LAB — CMP14+EGFR
A/G RATIO: 1.9 (ref 1.2–2.2)
ALBUMIN: 4.5 g/dL (ref 3.5–5.5)
ALT: 13 IU/L (ref 0–32)
AST: 13 IU/L (ref 0–40)
Alkaline Phosphatase: 69 IU/L (ref 39–117)
BUN/Creatinine Ratio: 11 (ref 9–23)
BUN: 8 mg/dL (ref 6–24)
Bilirubin Total: 0.6 mg/dL (ref 0.0–1.2)
CALCIUM: 8.9 mg/dL (ref 8.7–10.2)
CO2: 21 mmol/L (ref 18–29)
CREATININE: 0.75 mg/dL (ref 0.57–1.00)
Chloride: 100 mmol/L (ref 96–106)
GFR calc Af Amer: 114 mL/min/{1.73_m2} (ref >59)
GFR calc non Af Amer: 99 mL/min/{1.73_m2} (ref >59)
GLOBULIN, TOTAL: 2.4 g/dL (ref 1.5–4.5)
Glucose: 90 mg/dL (ref 65–99)
POTASSIUM: 3.9 mmol/L (ref 3.5–5.2)
SODIUM: 141 mmol/L (ref 134–144)
Total Protein: 6.9 g/dL (ref 6.0–8.5)

## 2015-09-10 LAB — CBC WITH DIFFERENTIAL/PLATELET
BASOS ABS: 0.1 10*3/uL (ref 0.0–0.2)
Basos: 1 %
EOS (ABSOLUTE): 0.2 10*3/uL (ref 0.0–0.4)
Eos: 4 %
HEMOGLOBIN: 13.6 g/dL (ref 11.1–15.9)
Hematocrit: 41.1 % (ref 34.0–46.6)
IMMATURE GRANULOCYTES: 0 %
Immature Grans (Abs): 0 10*3/uL (ref 0.0–0.1)
LYMPHS: 43 %
Lymphocytes Absolute: 2.8 10*3/uL (ref 0.7–3.1)
MCH: 30.4 pg (ref 26.6–33.0)
MCHC: 33.1 g/dL (ref 31.5–35.7)
MCV: 92 fL (ref 79–97)
MONOCYTES: 4 %
Monocytes Absolute: 0.3 10*3/uL (ref 0.1–0.9)
NEUTROS PCT: 48 %
Neutrophils Absolute: 3.2 10*3/uL (ref 1.4–7.0)
Platelets: 253 10*3/uL (ref 150–379)
RBC: 4.48 x10E6/uL (ref 3.77–5.28)
RDW: 12.9 % (ref 12.3–15.4)
WBC: 6.6 10*3/uL (ref 3.4–10.8)

## 2015-09-10 LAB — LIPID PANEL
CHOLESTEROL TOTAL: 181 mg/dL (ref 100–199)
Chol/HDL Ratio: 3 ratio units (ref 0.0–4.4)
HDL: 61 mg/dL (ref >39)
LDL CALC: 108 mg/dL — AB (ref 0–99)
TRIGLYCERIDES: 61 mg/dL (ref 0–149)
VLDL Cholesterol Cal: 12 mg/dL (ref 5–40)

## 2015-09-10 LAB — TSH: TSH: 3.23 u[IU]/mL (ref 0.450–4.500)

## 2015-09-10 LAB — HIV ANTIBODY (ROUTINE TESTING W REFLEX): HIV SCREEN 4TH GENERATION: NONREACTIVE

## 2015-09-16 ENCOUNTER — Ambulatory Visit: Payer: BLUE CROSS/BLUE SHIELD | Attending: Family Medicine | Admitting: Physical Therapy

## 2015-09-16 ENCOUNTER — Encounter: Payer: Self-pay | Admitting: Physical Therapy

## 2015-09-16 DIAGNOSIS — M6281 Muscle weakness (generalized): Secondary | ICD-10-CM | POA: Diagnosis present

## 2015-09-16 DIAGNOSIS — M5416 Radiculopathy, lumbar region: Secondary | ICD-10-CM

## 2015-09-16 DIAGNOSIS — M5417 Radiculopathy, lumbosacral region: Secondary | ICD-10-CM | POA: Diagnosis not present

## 2015-09-16 NOTE — Therapy (Signed)
[facility] [address] [place], [place], [address] Phone: [phone]   Fax:  [phone]  Physical Therapy Evaluation  Patient Details  Name: Kimberly Shaw MRN: [id number] Date of Birth: 11/02/1975 Referring Provider: Ivin BootyJoshua Dettinger MD  Encounter Date: 09/16/2015      PT End of Session - 09/16/15 0904    Visit Number 1   Number of Visits 12   Date for PT Re-Evaluation 10/28/15   PT Start Time 0820   PT Stop Time 0904   PT Time Calculation (min) 44 min   Activity Tolerance Patient tolerated treatment well   Behavior During Therapy [facility] for tasks assessed/performed      Past Medical History  Diagnosis Date  . Thyroid disease   . Migraines     Past Surgical History  Procedure Laterality Date  . Eye surgery      There were no vitals filed for this visit.       Subjective Assessment - 09/16/15 0817    Subjective Patient began having left sciatic pain about one year ago. Pain started as a twinge and has progressed. Pain is worse at night and when she first gets up for about an hour to hour and a half. Also feels like she can't support her leg at first. Patient also reports numbness in lateral lower leg and  toes 3-5.   Pertinent History hypothyroidism   Limitations --   Diagnostic tests xrays   Patient Stated Goals decrease pain, sit and sleep longer   Currently in Pain? Yes   Pain Score 4    Pain Location Back   Pain Orientation Left  sometimes across back; mostly lat/post thigh   Pain Descriptors / Indicators Tightness;Burning   Pain Type Chronic pain   Pain Radiating Towards LLE to toes   Pain Onset More than a month ago   Pain Frequency Constant   Aggravating Factors  standing after sitting and moving in bed   Pain Relieving Factors moving   Effect of Pain on Daily Activities pain with ADLS            [facility] - 09/16/15 0001    Assessment   Medical Diagnosis Left sciatic nerve pain   Referring Provider Arville CareJoshua Dettinger MD   Onset Date/Surgical Date 08/24/14   Next MD Visit not scheduled   Precautions   Precautions None   Precaution Comments Allergic to bee stings   Balance Screen   Has the patient fallen in the past 6 months No   Has the patient had a decrease in activity level because of a fear of falling?  No   Is the patient reluctant to leave their home because of a fear of falling?  No   Prior Function   Level of Independence Independent   Vocation Full time employment   Vocation Requirements desk job   Posture/Postural Control   Posture Comments increased lumbar lordosis; even pelvic landmarks   ROM / Strength   AROM / PROM / Strength AROM;Strength   AROM   Overall AROM Comments Lumbar flex fingertips 6 inch from floor, extension limited on left and left SB 25%. Pain with flex, ext and LSB   Strength   Strength Assessment Site Hip;Knee;Ankle   Right/Left Hip Left   Left Hip Flexion 4/5   Left Hip Extension 4/5   Left Hip ABduction 4+/5   Right/Left Knee Left   Left Knee Flexion 4+/5   Left Knee Extension 5/5   Right/Left  Ankle Left   Left Ankle Dorsiflexion 5/5   Flexibility   Soft Tissue Assessment /Muscle Length --  B hips/knees WNL   Palpation   Palpation comment unremarkable   Special Tests    Special Tests Lumbar   Lumbar Tests Straight Leg Raise;Slump Test;other   Slump test   Findings Positive   Side Left   Straight Leg Raise   Findings Positive   Side  Left   other   Comments Decreased DTR in L achilles vs. Rt; PT WNL B                   [facility] Adult PT Treatment/Exercise - 09/16/15 0001    Manual Therapy   Manual Therapy Manual Traction   Manual therapy comments no pain noted; no change in sx   Manual Traction 3 bouts of 30 seconds                PT Education - 09/16/15 1006    Education provided Yes   Education Details HEP; body mechanics and ADL modification   Person(s) Educated Patient   Methods  Demonstration;Handout;Explanation   Comprehension Returned demonstration;Verbalized understanding             PT Long Term Goals - 09/16/15 1148    PT LONG TERM GOAL #1   Title I with HEP   Time 6   Period Weeks   Status New   PT LONG TERM GOAL #2   Title no reports of LLE sx   Time 6   Period Weeks   Status New   PT LONG TERM GOAL #3   Title improved LLE strength to 5/5   Time 6   Period Weeks   Status New   PT LONG TERM GOAL #4   Title able to stand from sitting without weakness noted in LLE   Time 6   Period Weeks   Status New   PT LONG TERM GOAL #5   Title able to sleep without waking from pain   Time 6   Period Weeks   Status New               Plan - 09/16/15 1007    Clinical Impression Statement Patient presents with signs and symptoms consistent with a lumbar disc derangement. She is able to abolish pain with prone lying at this time. She has weakness and numbness in the LLE and decreased DTR on left. Patient tolerated manual lumbar traction without pain, but no noticeable change in sx.    Rehab Potential Good   PT Frequency 2x / week   PT Duration 6 weeks   PT Treatment/Interventions ADLs/Self Care Home Management;Electrical Stimulation;Moist Heat;Cryotherapy;Ultrasound;Therapeutic exercise;Manual techniques;Dry needling;Patient/family education;Passive range of motion;Neuromuscular re-education;Traction   PT Next Visit Plan extension protocol, core stabilization, review nerve glide and possibly add alterate seated glide, initiate traction.   PT Home Exercise Plan sciatic nerve glide, POE, prone press ups as tolerated   Consulted and Agree with Plan of Care Patient      Patient will benefit from skilled therapeutic intervention in order to improve the following deficits and impairments:  Pain, Decreased strength, Impaired sensation, Postural dysfunction, Decreased range of motion, Decreased activity tolerance  Visit Diagnosis: Lumbar back pain with  radiculopathy affecting left lower extremity - Plan: PT plan of care cert/re-cert  Muscle weakness (generalized) - Plan: PT plan of care cert/re-cert     Problem List Patient Active Problem List   Diagnosis Date Noted  .  Left sciatic nerve pain 05/22/2015  . Hypothyroidism 05/22/2015    Solon Palm PT  09/16/2015, 11:56 AM  [facility] [address] [place], [place], [address] Phone: [phone]   Fax:  [phone]  Name: Kimberly Shaw MRN: [id number] Date of Birth: 07/02/1975

## 2015-09-19 ENCOUNTER — Encounter: Payer: Self-pay | Admitting: Physical Therapy

## 2015-09-19 ENCOUNTER — Ambulatory Visit: Payer: BLUE CROSS/BLUE SHIELD | Admitting: Physical Therapy

## 2015-09-19 DIAGNOSIS — M6281 Muscle weakness (generalized): Secondary | ICD-10-CM

## 2015-09-19 DIAGNOSIS — M5416 Radiculopathy, lumbar region: Secondary | ICD-10-CM

## 2015-09-19 DIAGNOSIS — M5417 Radiculopathy, lumbosacral region: Secondary | ICD-10-CM | POA: Diagnosis not present

## 2015-09-19 NOTE — Therapy (Signed)
[facility] [address] [place], [place], [address] Phone: [phone]   Fax:  [phone]  Physical Therapy Treatment  Patient Details  Name: Kimberly Shaw MRN: [id number] Date of Birth: Mar 21, 1976 Referring Provider: Ivin Booty Dettinger MD  Encounter Date: 09/19/2015      PT End of Session - 09/19/15 1644    Visit Number 2   Number of Visits 12   Date for PT Re-Evaluation 10/28/15   PT Start Time 1643   PT Stop Time 1733   PT Time Calculation (min) 50 min   Activity Tolerance Patient tolerated treatment well   Behavior During Therapy [facility] for tasks assessed/performed      Past Medical History  Diagnosis Date  . Thyroid disease   . Migraines     Past Surgical History  Procedure Laterality Date  . Eye surgery      There were no vitals filed for this visit.      Subjective Assessment - 09/19/15 1643    Subjective Reports intermittant pain today. Reports that pain is mostly when trying to sleep. States that sleeping prone over pillows has helped although she reports stiffness/soreness after staying in one position too long.   Pertinent History hypothyroidism   Diagnostic tests xrays   Patient Stated Goals decrease pain, sit and sleep longer   Currently in Pain? Yes   Pain Score 2    Pain Location Back   Pain Orientation Left;Lower   Pain Descriptors / Indicators Other (Comment)  "twinges"   Pain Type Chronic pain   Pain Onset More than a month ago   Pain Frequency Intermittent            [facility] PT Assessment - 09/19/15 0001    Assessment   Medical Diagnosis Left sciatic nerve pain   Onset Date/Surgical Date 08/24/14   Next MD Visit not scheduled   Precautions   Precautions None   Precaution Comments Allergic to bee stings                     [facility] Adult PT Treatment/Exercise - 09/19/15 0001    Exercises   Exercises Lumbar   Lumbar Exercises: Aerobic   Stationary Bike L4 x10 min   Lumbar  Exercises: Supine   Ab Set 20 reps;5 seconds   Bent Knee Raise 20 reps   Bridge 20 reps   Straight Leg Raise 20 reps   Lumbar Exercises: Sidelying   Hip Abduction 20 reps  BLE   Lumbar Exercises: Prone   Other Prone Lumbar Exercises POE x2 min   Other Prone Lumbar Exercises Prone pressup x10 reps   Modalities   Modalities Traction   Traction   Type of Traction Lumbar   Min (lbs) 5   Max (lbs) 60   Hold Time 99   Rest Time 5   Time 15                     PT Long Term Goals - 09/19/15 1652    PT LONG TERM GOAL #1   Title I with HEP   Time 6   Period Weeks   Status Achieved   PT LONG TERM GOAL #2   Title no reports of LLE sx   Time 6   Period Weeks   Status On-going   PT LONG TERM GOAL #3   Title improved LLE strength to 5/5   Time 6   Period Weeks   Status On-going  PT LONG TERM GOAL #4   Title able to stand from sitting without weakness noted in LLE   Time 6   Period Weeks   Status On-going   PT LONG TERM GOAL #5   Title able to sleep without waking from pain   Time 6   Period Weeks   Status On-going               Plan - 09/19/15 1723    Clinical Impression Statement Patient tolerated today's treatment well with no reports of exaggerated pain. Patient able to report LLE weakness with bridge and SLR as she noted greater fatigue and lack of smooth motion. Patient reported that she noted a sensation in L buttock region upon starting POE exercise. Patient also reported L buttock sensation upon standing following exercises. Patient required moderate multimodal cueing for all supine exercises completed today for core activation and exercise technique. Patient continues to experience LLE weakness and numbness at this time. Mechanical lumbar traction initated at 60# with normal response noted . Patient experienced discomfort upon sitting following traction although patient reports difficulty with that transitiional movement. Patient denied pain  following today's treatment.   Rehab Potential Good   PT Frequency 2x / week   PT Duration 6 weeks   PT Treatment/Interventions ADLs/Self Care Home Management;Electrical Stimulation;Moist Heat;Cryotherapy;Ultrasound;Therapeutic exercise;Manual techniques;Dry needling;Patient/family education;Passive range of motion;Neuromuscular re-education;Traction   PT Next Visit Plan Continue with extension and core exercises with traction per MPT POC.   PT Home Exercise Plan sciatic nerve glide, POE, prone press ups as tolerated   Consulted and Agree with Plan of Care Patient      Patient will benefit from skilled therapeutic intervention in order to improve the following deficits and impairments:  Pain, Decreased strength, Impaired sensation, Postural dysfunction, Decreased range of motion, Decreased activity tolerance  Visit Diagnosis: Lumbar back pain with radiculopathy affecting left lower extremity  Muscle weakness (generalized)     Problem List Patient Active Problem List   Diagnosis Date Noted  . Left sciatic nerve pain 05/22/2015  . Hypothyroidism 05/22/2015    Evelene CroonKelsey M Parsons, PTA 09/19/2015, 5:37 PM  [facility] [address] [place], [place], [address] Phone: [phone]   Fax:  [phone]  Name: Kimberly Shaw MRN: [id number] Date of Birth: 12/27/1975

## 2015-09-26 ENCOUNTER — Ambulatory Visit: Payer: BLUE CROSS/BLUE SHIELD | Attending: Family Medicine | Admitting: Physical Therapy

## 2015-09-26 ENCOUNTER — Encounter: Payer: Self-pay | Admitting: Physical Therapy

## 2015-09-26 DIAGNOSIS — M6281 Muscle weakness (generalized): Secondary | ICD-10-CM | POA: Insufficient documentation

## 2015-09-26 DIAGNOSIS — M5417 Radiculopathy, lumbosacral region: Secondary | ICD-10-CM | POA: Diagnosis not present

## 2015-09-26 DIAGNOSIS — M5416 Radiculopathy, lumbar region: Secondary | ICD-10-CM

## 2015-09-26 NOTE — Therapy (Signed)
[facility] [address] [place], [place], [address] Phone: [phone]   Fax:  [phone]  Physical Therapy Treatment  Patient Details  Name: Kimberly Shaw MRN: [id number] Date of Birth: 11/16/1975 Referring Provider: Ivin BootyJoshua Dettinger MD  Encounter Date: 09/26/2015      PT End of Session - 09/26/15 1657    Visit Number 3   Number of Visits 12   Date for PT Re-Evaluation 10/28/15   PT Start Time 1648   PT Stop Time 1741   PT Time Calculation (min) 53 min   Activity Tolerance Patient tolerated treatment well   Behavior During Therapy [facility] for tasks assessed/performed      Past Medical History  Diagnosis Date  . Thyroid disease   . Migraines     Past Surgical History  Procedure Laterality Date  . Eye surgery      There were no vitals filed for this visit.      Subjective Assessment - 09/26/15 1654    Subjective Reports that her back still bothers her at night mostly and she has good and bad days. Reports that it is tough to find a comfortable place to sit as most of her furniture is softer. Reports that she did good following previous traction. States that she awoke last night around 2 am with discomfort and was hard to fall back asleep. The previous two nights she reports that she slept well which is unusual. Reports that numbness is always constant but pain is intermittant.   Pertinent History hypothyroidism   Diagnostic tests xrays   Patient Stated Goals decrease pain, sit and sleep longer   Currently in Pain? Yes   Pain Score 2    Pain Location Hip   Pain Orientation Left   Pain Descriptors / Indicators Dull   Pain Type Chronic pain   Pain Onset More than a month ago            [facility] - 09/26/15 0001    Assessment   Medical Diagnosis Left sciatic nerve pain   Onset Date/Surgical Date 08/24/14   Next MD Visit not scheduled   Precautions   Precautions None   Precaution Comments Allergic to bee  stings                     [facility] Adult PT Treatment/Exercise - 09/26/15 0001    Lumbar Exercises: Aerobic   Stationary Bike L4 x10 min   Lumbar Exercises: Standing   Row Strengthening;Both;20 reps  Pink XTS   Shoulder Extension Strengthening;Both;20 reps  Pink XTS   Lumbar Exercises: Supine   Bent Knee Raise 20 reps   Bridge 20 reps   Straight Leg Raise 20 reps  BLE   Lumbar Exercises: Sidelying   Hip Abduction 20 reps  BLE   Lumbar Exercises: Prone   Straight Leg Raise 20 reps;Other (comment)  BLE   Other Prone Lumbar Exercises POE with L roadkill x2 min; prone heel squeeze x15 reps  Experienced L foot tingling   Other Prone Lumbar Exercises Prone pressup with L roadkill x10 reps   Modalities   Modalities Traction   Traction   Type of Traction Lumbar   Min (lbs) 5   Max (lbs) 60   Hold Time 99   Rest Time 5   Time 15                     PT Long Term Goals - 09/19/15 1652  PT LONG TERM GOAL #1   Title I with HEP   Time 6   Period Weeks   Status Achieved   PT LONG TERM GOAL #2   Title no reports of LLE sx   Time 6   Period Weeks   Status On-going   PT LONG TERM GOAL #3   Title improved LLE strength to 5/5   Time 6   Period Weeks   Status On-going   PT LONG TERM GOAL #4   Title able to stand from sitting without weakness noted in LLE   Time 6   Period Weeks   Status On-going   PT LONG TERM GOAL #5   Title able to sleep without waking from pain   Time 6   Period Weeks   Status On-going               Plan - 09/26/15 1729    Clinical Impression Statement Patient tolerated today's treatment with only report of pressure in L lateral hip with prone pressups with L roadkill. Patient noted that supine core activation was easier today and completed supine exercises well. Patient experienced difficulty/ weakness with prone hip extension. Patient recieved VCs regarding posture with standing rows and extension today. Patient  experienced cramping in L superior HS with prone heel squeezes. Traction max weight was increased to 65# as patient continues to experience LLE symptoms. Patient experienced a short "twinge' of pain in L hip/ SI region upon initially standing following traction but no other pain.   Rehab Potential Good   PT Frequency 2x / week   PT Duration 6 weeks   PT Treatment/Interventions ADLs/Self Care Home Management;Electrical Stimulation;Moist Heat;Cryotherapy;Ultrasound;Therapeutic exercise;Manual techniques;Dry needling;Patient/family education;Passive range of motion;Neuromuscular re-education;Traction   PT Next Visit Plan Continue with extension and core exercises with traction per MPT POC.   PT Home Exercise Plan sciatic nerve glide, POE, prone press ups as tolerated   Consulted and Agree with Plan of Care Patient      Patient will benefit from skilled therapeutic intervention in order to improve the following deficits and impairments:  Pain, Decreased strength, Impaired sensation, Postural dysfunction, Decreased range of motion, Decreased activity tolerance  Visit Diagnosis: Lumbar back pain with radiculopathy affecting left lower extremity  Muscle weakness (generalized)     Problem List Patient Active Problem List   Diagnosis Date Noted  . Left sciatic nerve pain 05/22/2015  . Hypothyroidism 05/22/2015    Kimberly Shaw, PTA  09/26/2015, 5:47 PM  [facility] [address] [place], [place], [address] Phone: [phone]   Fax:  [phone]  Name: Kimberly Shaw MRN: [id number] Date of Birth: Oct 06, 1975

## 2015-10-03 ENCOUNTER — Ambulatory Visit: Payer: BLUE CROSS/BLUE SHIELD | Admitting: Physical Therapy

## 2015-10-03 DIAGNOSIS — M5417 Radiculopathy, lumbosacral region: Secondary | ICD-10-CM | POA: Diagnosis not present

## 2015-10-03 DIAGNOSIS — M6281 Muscle weakness (generalized): Secondary | ICD-10-CM

## 2015-10-03 DIAGNOSIS — M5416 Radiculopathy, lumbar region: Secondary | ICD-10-CM

## 2015-10-03 NOTE — Therapy (Signed)
[facility] [address] [place], [place], [address] Phone: [phone]   Fax:  [phone]  Name: [name] MRN: [id number] Date of Birth: 1976/04/05  [facility] [address] [place], [place], [address] Phone: [phone]   Fax:  [phone]  Physical Therapy Treatment  Patient Details  Name: Kimberly Shaw MRN: [id number] Date of Birth: 01/13/1976 Referring Provider: Ivin BootyJoshua Dettinger MD  Encounter Date: 10/03/2015      PT End of Session - 10/03/15 1829    Visit Number 4   Number of Visits 12   Date for PT Re-Evaluation 10/28/15   PT Start Time 0450   PT Stop Time 0541   PT Time Calculation (min) 51 min      Past Medical History  Diagnosis Date  . Thyroid disease   . Migraines     Past Surgical History  Procedure Laterality Date  . Eye surgery      There were no vitals filed for this visit.      Subjective Assessment - 10/03/15 1830    Subjective My pain is not as bad as it was.  I am sleepin better also.   Pain Score 2    Pain Location Hip   Pain Orientation Left   Pain Descriptors / Indicators Dull   Pain Type Chronic pain   Pain Onset More than a month ago                         [facility] - 10/03/15 0001    Modalities   Modalities Traction;Ultrasound   Ultrasound   Ultrasound Location Left lower back region   Ultrasound Parameters 1.50 W/CM2 x 12 minutes.   Ultrasound Goals Pain   Traction   Type of Traction Lumbar   Min (lbs) 5   Max (lbs) 70   Hold Time 99   Rest Time 5   Time 15   Manual Therapy   Manual therapy comments Right sdly position with 2 pillows between knees for comfort:  STW/M x 12 minutes to left sacral-iliac ligament (tender to palpation) and QL release (taut to palpation).                     PT Long Term Goals - 09/19/15 1652    PT LONG TERM GOAL #1   Title I with HEP   Time 6   Period Weeks   Status Achieved   PT LONG TERM GOAL #2   Title no reports of LLE sx   Time 6   Period Weeks   Status On-going   PT LONG TERM GOAL #3   Title improved LLE strength to 5/5   Time  6   Period Weeks   Status On-going   PT LONG TERM GOAL #4   Title able to stand from sitting without weakness noted in LLE   Time 6   Period Weeks   Status On-going   PT LONG TERM GOAL #5   Title able to sleep without waking from pain   Time 6   Period Weeks   Status On-going             Patient will benefit from skilled therapeutic intervention in order to improve the following deficits and impairments:     Visit Diagnosis: Lumbar back pain with radiculopathy affecting left lower extremity  Muscle weakness (generalized)     Problem List Patient Active Problem List   Diagnosis Date Noted  . Left sciatic nerve pain 05/22/2015  . Hypothyroidism 05/22/2015    Leannah Guse, [place] MPT 10/03/2015, 6:40 PM  [facility]

## 2015-10-10 ENCOUNTER — Ambulatory Visit: Payer: BLUE CROSS/BLUE SHIELD | Admitting: Physical Therapy

## 2015-10-10 ENCOUNTER — Encounter: Payer: Self-pay | Admitting: Physical Therapy

## 2015-10-10 DIAGNOSIS — M5417 Radiculopathy, lumbosacral region: Secondary | ICD-10-CM | POA: Diagnosis not present

## 2015-10-10 DIAGNOSIS — M6281 Muscle weakness (generalized): Secondary | ICD-10-CM

## 2015-10-10 DIAGNOSIS — M5416 Radiculopathy, lumbar region: Secondary | ICD-10-CM

## 2015-10-10 NOTE — Patient Instructions (Signed)
Stretching: Hamstring (Supine)    With belt or leash wrapped around foot and that knee straight, lift right/left leg straight up until stretch is felt. Hold _30___ seconds. Repeat __3__ times per set. Do __1__ sets per session. Do _2-3___ sessions per day.  http://orth.exer.us/656   Copyright  VHI. All rights reserved.  Stretching: Piriformis (Supine)    Pull right/left knee toward opposite shoulder. Hold __30__ seconds. Relax. Repeat __3__ times per set. Do _1___ sets per session. Do _2-3___ sessions per day.  http://orth.exer.us/712   Copyright  VHI. All rights reserved.

## 2015-10-10 NOTE — Therapy (Signed)
[facility] [address] [place], [place], [address] Phone: [phone]   Fax:  [phone]  Physical Therapy Treatment  Patient Details  Name: Dannelle Rhymes MRN: [id number] Date of Birth: 1975/10/29 Referring Provider: Ivin Booty Dettinger MD  Encounter Date: 10/10/2015      PT End of Session - 10/10/15 1646    Visit Number 5   Number of Visits 12   Date for PT Re-Evaluation 10/28/15   PT Start Time 1641   PT Stop Time 1740   PT Time Calculation (min) 59 min   Activity Tolerance Patient tolerated treatment well   Behavior During Therapy [facility] for tasks assessed/performed      Past Medical History  Diagnosis Date  . Thyroid disease   . Migraines     Past Surgical History  Procedure Laterality Date  . Eye surgery      There were no vitals filed for this visit.      Subjective Assessment - 10/10/15 1643    Subjective Reports she is waking multiple times a night since last week. States that she is having some discomfort down just below L buttock. Pain is still worse at nights. States that she has days where she isn't bothered much and then she has days where she cannot get comfortable. Reports that she did not really notice weakness before PT.    Pertinent History hypothyroidism   Diagnostic tests xrays   Patient Stated Goals decrease pain, sit and sleep longer   Currently in Pain? Yes   Pain Score 2    Pain Location Leg   Pain Orientation Left;Upper   Pain Type Chronic pain   Pain Radiating Towards to below L buttock   Pain Onset More than a month ago            [facility] PT Assessment - 10/10/15 0001    Assessment   Medical Diagnosis Left sciatic nerve pain   Onset Date/Surgical Date 08/24/14   Next MD Visit not scheduled; After PT   Precautions   Precautions None   Precaution Comments Allergic to bee stings                     [facility] Adult PT Treatment/Exercise - 10/10/15 0001    Lumbar Exercises:  Stretches   Active Hamstring Stretch 3 reps;30 seconds;Other (comment)  BLE   Single Knee to Chest Stretch 3 reps;30 seconds;Other (comment)  BLE   Piriformis Stretch 3 reps;30 seconds  BLE   Lumbar Exercises: Aerobic   Stationary Bike L4 x10 min   Lumbar Exercises: Supine   Bent Knee Raise 20 reps   Bridge 20 reps   Straight Leg Raise 20 reps;Other (comment)  BLE   Lumbar Exercises: Prone   Straight Leg Raise 20 reps;Other (comment)  x20 reps   Other Prone Lumbar Exercises POE with L roadkill x3 min   Other Prone Lumbar Exercises Prone pressup x10 reps   Modalities   Modalities Traction   Traction   Type of Traction Lumbar   Min (lbs) 5   Max (lbs) 70   Hold Time 99   Rest Time 5   Time 15                PT Education - 10/10/15 1715    Education provided Yes   Education Details HEP- Piriformis and HS stretch   Person(s) Educated Patient   Methods Explanation;Demonstration;Verbal cues;Handout   Comprehension Verbalized understanding;Returned demonstration;Verbal cues required  PT Long Term Goals - 09/19/15 1652    PT LONG TERM GOAL #1   Title I with HEP   Time 6   Period Weeks   Status Achieved   PT LONG TERM GOAL #2   Title no reports of LLE sx   Time 6   Period Weeks   Status On-going   PT LONG TERM GOAL #3   Title improved LLE strength to 5/5   Time 6   Period Weeks   Status On-going   PT LONG TERM GOAL #4   Title able to stand from sitting without weakness noted in LLE   Time 6   Period Weeks   Status On-going   PT LONG TERM GOAL #5   Title able to sleep without waking from pain   Time 6   Period Weeks   Status On-going               Plan - 10/10/15 1737    Clinical Impression Statement Patient tolerated today's treatment well with no reports of increased pain with exercises. Patient arrived with reports of LLE discomfort to just below L buttock and states that at times she feels that if she massaged L HS it  would feel better. Patient continued to experience LLE symptoms to upper L HS with POE with L roadkill position. Patient had positive response to L HS, SKTC, and Piriformis stretch and patient noted that she more of a stretch with L Piriformis stretch. Patient also noted that her L knee does not extend as fully as the R with HS stretch. Normal traction response with 70# max weight again today. Patient experienced "a twinge" of discomfort in supine following traction but upon standing she denied L HS region discomfort only of R low back discomfort just superior to L iliac crest. Patient accepted new HEP with Piriformis and HS stretch for home wihtout questions and verablized understanding.   Rehab Potential Good   PT Frequency 2x / week   PT Duration 6 weeks   PT Treatment/Interventions ADLs/Self Care Home Management;Electrical Stimulation;Moist Heat;Cryotherapy;Ultrasound;Therapeutic exercise;Manual techniques;Dry needling;Patient/family education;Passive range of motion;Neuromuscular re-education;Traction   PT Next Visit Plan Continue with extension and core exercises with traction per MPT POC.   PT Home Exercise Plan sciatic nerve glide, POE, prone press ups as tolerated   Consulted and Agree with Plan of Care Patient      Patient will benefit from skilled therapeutic intervention in order to improve the following deficits and impairments:  Pain, Decreased strength, Impaired sensation, Postural dysfunction, Decreased range of motion, Decreased activity tolerance  Visit Diagnosis: Lumbar back pain with radiculopathy affecting left lower extremity  Muscle weakness (generalized)     Problem List Patient Active Problem List   Diagnosis Date Noted  . Left sciatic nerve pain 05/22/2015  . Hypothyroidism 05/22/2015    Evelene CroonKelsey M Parsons, PTA 10/10/2015, 5:54 PM  [facility] [address] [place], [place], [address] Phone: [phone]   Fax:   [phone]  Name: Dara Lordseggy Servantes MRN: [id number] Date of Birth: 09/17/1975

## 2015-10-18 ENCOUNTER — Ambulatory Visit: Payer: BLUE CROSS/BLUE SHIELD | Admitting: Physical Therapy

## 2015-10-18 ENCOUNTER — Encounter: Payer: Self-pay | Admitting: Physical Therapy

## 2015-10-18 DIAGNOSIS — M6281 Muscle weakness (generalized): Secondary | ICD-10-CM

## 2015-10-18 DIAGNOSIS — M5417 Radiculopathy, lumbosacral region: Secondary | ICD-10-CM | POA: Diagnosis not present

## 2015-10-18 DIAGNOSIS — M5416 Radiculopathy, lumbar region: Secondary | ICD-10-CM

## 2015-10-18 NOTE — Therapy (Signed)
[facility] [place], [place], [address] Phone: [phone]   Fax:  [phone]  Physical Therapy Treatment  Patient Details  Name: Kimberly Shaw MRN: [id number] Date of Birth: [birth year] Referring Provider: Vonna Kotyk Dettinger MD  Encounter Date: 10/18/2015      PT End of Session - 10/18/15 0915    Visit Number 6   Number of Visits 12   Date for PT Re-Evaluation 10/28/15   PT Start Time 0859   PT Stop Time 0946   PT Time Calculation (min) 47 min   Activity Tolerance Patient tolerated treatment well   Behavior During Therapy [facility] for tasks assessed/performed      Past Medical History  Diagnosis Date  . Thyroid disease   . Migraines     Past Surgical History  Procedure Laterality Date  . Eye surgery      There were no vitals filed for this visit.      Subjective Assessment - 10/18/15 0900    Subjective Reports that she continues to experience sensation to just below L buttock region but not as intense as before.   Pertinent History hypothyroidism   Diagnostic tests xrays   Patient Stated Goals decrease pain, sit and sleep longer   Currently in Pain? Yes   Pain Score 1    Pain Location Leg   Pain Orientation Left;Upper   Pain Descriptors / Indicators Tightness   Pain Type Chronic pain   Pain Onset More than a month ago            [facility] PT Assessment - 10/18/15 0001    Assessment   Medical Diagnosis Left sciatic nerve pain   Onset Date/Surgical Date 08/24/14   Next MD Visit not scheduled; After PT   Precautions   Precautions None   Precaution Comments Allergic to bee stings                     [facility] Adult PT Treatment/Exercise - 10/18/15 0001    Lumbar Exercises: Stretches   Active Hamstring Stretch 3 reps;30 seconds;Other (comment)  LLE   Piriformis Stretch 3 reps;30 seconds  LLE   Lumbar Exercises: Aerobic   Stationary Bike L2 x10 min   Lumbar Exercises: Standing   Row  Strengthening;Both  x30 reps with Pink XTS   Shoulder Extension Strengthening;Both  x30 reps with Pink XTS   Lumbar Exercises: Supine   Bent Knee Raise 20 reps   Bridge 20 reps   Straight Leg Raise 20 reps;Other (comment)  BLE   Traction   Type of Traction Lumbar   Min (lbs) 5   Max (lbs) 75   Hold Time 99   Rest Time 5   Time 15                     PT Long Term Goals - 10/18/15 0916    PT LONG TERM GOAL #1   Title I with HEP   Time 6   Period Weeks   Status Achieved   PT LONG TERM GOAL #2   Title no reports of LLE sx   Time 6   Period Weeks   Status On-going   PT LONG TERM GOAL #3   Title improved LLE strength to 5/5   Time 6   Period Weeks   Status On-going   PT LONG TERM GOAL #4   Title able to stand from sitting without weakness noted in LLE   Time 6  Period Weeks   Status Achieved   PT LONG TERM GOAL #5   Title able to sleep without waking from pain   Time 6   Period Weeks   Status Partially Met  Intermittantly waking with not as intense pain 10/18/2015               Plan - 10/18/15 0931    Clinical Impression Statement Patient tolerated today's treatment well in regards to low back pain although she arrived with sinus symptoms per patient report. Patient continues to experiences symptoms in LLE down to just below L buttock but not as severe as what she was experiencing before. Patient has learned different mechanisms for coping with siting for too long or which of her furniture is better for her back. Patient able to complete all exercises today without report of increased pain or discomfort. Patient reports symptoms are now at least 75% better than when she started therapy. Reports now the LLE pain feels more like a tight muscle. Traction increased to 75# max weight per MPT approval to decrease LLE symptoms. Patient denied pain following today's treatment.   Rehab Potential Good   PT Frequency 2x / week   PT Duration 6 weeks   PT  Treatment/Interventions ADLs/Self Care Home Management;Electrical Stimulation;Moist Heat;Cryotherapy;Ultrasound;Therapeutic exercise;Manual techniques;Dry needling;Patient/family education;Passive range of motion;Neuromuscular re-education;Traction   PT Next Visit Plan Continue with extension and core exercises with traction per MPT POC.   PT Home Exercise Plan sciatic nerve glide, POE, prone press ups as tolerated   Consulted and Agree with Plan of Care Patient      Patient will benefit from skilled therapeutic intervention in order to improve the following deficits and impairments:  Pain, Decreased strength, Impaired sensation, Postural dysfunction, Decreased range of motion, Decreased activity tolerance  Visit Diagnosis: Lumbar back pain with radiculopathy affecting left lower extremity  Muscle weakness (generalized)     Problem List Patient Active Problem List   Diagnosis Date Noted  . Left sciatic nerve pain 05/22/2015  . Hypothyroidism 05/22/2015    Wynelle Fanny, PTA 10/18/2015, 9:52 AM  [facility] [address] [place], [place], [address] Phone: [phone]   Fax:  [phone]  Name: Kimberly Shaw MRN: [id number] Date of Birth: 05-13-1976

## 2015-10-24 ENCOUNTER — Encounter: Payer: BLUE CROSS/BLUE SHIELD | Admitting: Physical Therapy

## 2015-10-30 ENCOUNTER — Encounter: Payer: Self-pay | Admitting: Family Medicine

## 2015-10-31 ENCOUNTER — Encounter: Payer: Self-pay | Admitting: Physical Therapy

## 2015-10-31 ENCOUNTER — Ambulatory Visit: Payer: BLUE CROSS/BLUE SHIELD | Attending: Family Medicine | Admitting: Physical Therapy

## 2015-10-31 DIAGNOSIS — M6281 Muscle weakness (generalized): Secondary | ICD-10-CM

## 2015-10-31 DIAGNOSIS — M5417 Radiculopathy, lumbosacral region: Secondary | ICD-10-CM | POA: Insufficient documentation

## 2015-10-31 DIAGNOSIS — M5416 Radiculopathy, lumbar region: Secondary | ICD-10-CM

## 2015-10-31 NOTE — Therapy (Addendum)
[facility] [place], [place], [address] Phone: [phone]   Fax:  [phone]  Physical Therapy Treatment  Patient Details  Name: Kimberly Shaw MRN: [id number] Date of Birth: 05-28-1975 Referring Provider: Vonna Kotyk Dettinger MD  Encounter Date: 10/31/2015      PT End of Session - 10/31/15 1639    Visit Number 7   Number of Visits 12   Date for PT Re-Evaluation 10/28/15   PT Start Time 0938   PT Stop Time 1737   PT Time Calculation (min) 52 min   Activity Tolerance Patient tolerated treatment well   Behavior During Therapy [facility] for tasks assessed/performed      Past Medical History  Diagnosis Date  . Thyroid disease   . Migraines     Past Surgical History  Procedure Laterality Date  . Eye surgery      There were no vitals filed for this visit.      Subjective Assessment - 10/31/15 1639    Subjective Reports that a recent cold has exaggerated pain with coughing. Patient reports that she has not been able to sleep well recently especially last night as she had to lay on her back on the floor with her legs up on the couch with a heat pack.   Pertinent History hypothyroidism   Diagnostic tests xrays   Patient Stated Goals decrease pain, sit and sleep longer            [facility] PT Assessment - 10/31/15 0001    Assessment   Medical Diagnosis Left sciatic nerve pain   Onset Date/Surgical Date 08/24/14   Next MD Visit 11/04/2015   Precautions   Precautions None   Precaution Comments Allergic to bee stings   ROM / Strength   AROM / PROM / Strength Strength   Strength   Strength Assessment Site Hip;Knee   Right/Left Hip Left   Left Hip Flexion 4+/5   Left Hip Extension 4/5   Left Hip ABduction 5/5   Right/Left Knee Left   Left Knee Flexion 4+/5   Left Knee Extension 5/5   Right/Left Ankle Left   Left Ankle Dorsiflexion 5/5                     [facility] Adult PT Treatment/Exercise - 10/31/15 0001     Lumbar Exercises: Stretches   Active Hamstring Stretch 3 reps;30 seconds;Other (comment)  BLE   Prone on Elbows Stretch Other (comment)  x3 min   Piriformis Stretch 3 reps;30 seconds  BLE   Lumbar Exercises: Aerobic   Stationary Bike NuStep L4 x10 min   Lumbar Exercises: Supine   Bridge 20 reps   Modalities   Modalities Electrical Stimulation;Moist Heat   Moist Heat Therapy   Number Minutes Moist Heat 15 Minutes   Moist Heat Location Lumbar Spine   Electrical Stimulation   Electrical Stimulation Location L low back   Electrical Stimulation Action Pre-Mod   Electrical Stimulation Parameters 80-150 Hz x15 min   Electrical Stimulation Goals Pain                     PT Long Term Goals - 10/31/15 1655    PT LONG TERM GOAL #1   Title I with HEP   Time 6   Period Weeks   Status Achieved   PT LONG TERM GOAL #2   Title no reports of LLE sx   Time 6   Period Weeks   Status  On-going   PT LONG TERM GOAL #3   Title improved LLE strength to 5/5   Time 6   Period Weeks   Status Partially Met  L hip/knee/ankle 4/5-5/5 10/31/2015   PT LONG TERM GOAL #4   Title able to stand from sitting without weakness noted in LLE   Time 6   Period Weeks   Status Achieved   PT LONG TERM GOAL #5   Title able to sleep without waking from pain   Time 6   Period Weeks   Status Partially Met  Intermittantly waking with not as intense pain 10/18/2015               Plan - 10/31/15 1723    Clinical Impression Statement Patient has had limited progress with PT since beginning PT due to lumbar pain with radiation of pain down LLE. Patient reports that a recent cold and cough has exaggerated her lumbar pain. Mechanical traction has been utilized during PT to which at one point she reported decreased LLE pain. Patient had began to sleep better with waking fewer times due to pain but now has difficulty finding a comfortable position in sitting or in bed. Patient reports that she tries  prone position or other positions attempted in PT to no avail recently. Patient now reports LLE pain as a constant tight feeling. Patient also notes that she experiences discomfort in L SI region and to the left of her sacrum. LLE strength has improved minimally with increase in one muscle grade to L hip flexion and abduction. Electrical stimulation and moist heat were conducted today as no improvement has been noted with traction. Patient reported enjoying electrical stimulation with moist heat and added that that was the most comfortable she had been in a while.   Rehab Potential Good   PT Frequency 2x / week   PT Duration 6 weeks   PT Treatment/Interventions ADLs/Self Care Home Management;Electrical Stimulation;Moist Heat;Cryotherapy;Ultrasound;Therapeutic exercise;Manual techniques;Dry needling;Patient/family education;Passive range of motion;Neuromuscular re-education;Traction   PT Next Visit Plan Continue per MPT POC and per MD discretion.   PT Home Exercise Plan sciatic nerve glide, POE, prone press ups as tolerated   Consulted and Agree with Plan of Care Patient      Patient will benefit from skilled therapeutic intervention in order to improve the following deficits and impairments:  Pain, Decreased strength, Impaired sensation, Postural dysfunction, Decreased range of motion, Decreased activity tolerance  Visit Diagnosis: Lumbar back pain with radiculopathy affecting left lower extremity  Muscle weakness (generalized)     Problem List Patient Active Problem List   Diagnosis Date Noted  . Left sciatic nerve pain 05/22/2015  . Hypothyroidism 05/22/2015    Ahmed Prima, PTA 10/31/2015 6:01 PM Mali Applegate MPT [facility] [address] [place], [place], [address] Phone: [phone]   Fax:  [phone]  Name: Kimberly Shaw MRN: [id number] Date of Birth: 1975/12/27  PHYSICAL THERAPY DISCHARGE SUMMARY  Visits from Start of Care:  7.  Current functional level related to goals / functional outcomes: See above.   Remaining deficits: Continued left LE pain.   Education / Equipment: HEP. Plan: Patient agrees to discharge.  Patient goals were partially met. Patient is being discharged due to not returning since the last visit.  ?????        Mali Applegate MPT

## 2015-11-04 ENCOUNTER — Encounter: Payer: Self-pay | Admitting: Family Medicine

## 2015-11-04 ENCOUNTER — Ambulatory Visit (INDEPENDENT_AMBULATORY_CARE_PROVIDER_SITE_OTHER): Payer: BLUE CROSS/BLUE SHIELD | Admitting: Family Medicine

## 2015-11-04 VITALS — BP 107/74 | HR 73 | Temp 98.2°F | Ht 65.0 in | Wt 158.0 lb

## 2015-11-04 DIAGNOSIS — M5442 Lumbago with sciatica, left side: Secondary | ICD-10-CM | POA: Diagnosis not present

## 2015-11-04 DIAGNOSIS — M5432 Sciatica, left side: Secondary | ICD-10-CM | POA: Diagnosis not present

## 2015-11-04 NOTE — Progress Notes (Signed)
BP 107/74 mmHg  Pulse 73  Temp(Src) 98.2 F (36.8 C) (Oral)  Ht 5\' 5"  (1.651 m)  Wt 158 lb (71.668 kg)  BMI 26.29 kg/m2   Subjective:    Patient ID: Kimberly Shaw, female    DOB: 08/30/1975, 40 y.o.   MRN: [id number]  HPI: Kimberly Lordseggy Herrod is a 40 y.o. female presenting on 11/04/2015 for Back pain followup   HPI Low back pain with left sciatic nerve pain Patient has been having persistent low back pain with left sciatic nerve pain. When going on for about a year and she has done multiple different treatments including anti-inflammatories and steroids and NSAIDs. She is also done now at least 5 weeks of physical therapy and had some improvement with each of these but it is still significantly better. She would like to see if she can get an MRI to figure out what is the underlying issue.  Relevant past medical, surgical, family and social history reviewed and updated as indicated. Interim medical history since our last visit reviewed. Allergies and medications reviewed and updated.  Review of Systems  Constitutional: Negative for fever and chills.  HENT: Negative for congestion, ear discharge and ear pain.   Eyes: Negative for redness and visual disturbance.  Respiratory: Negative for chest tightness and shortness of breath.   Cardiovascular: Negative for chest pain and leg swelling.  Genitourinary: Negative for dysuria and difficulty urinating.  Musculoskeletal: Positive for back pain. Negative for gait problem.  Skin: Negative for rash.  Neurological: Positive for numbness. Negative for light-headedness and headaches.  Psychiatric/Behavioral: Negative for behavioral problems and agitation.  All other systems reviewed and are negative.   Per HPI unless specifically indicated above     Medication List       This list is accurate as of: 11/04/15  2:13 PM.  Always use your most recent med list.               cyclobenzaprine 10 MG tablet  Commonly known as:  FLEXERIL  Take 10  mg by mouth as needed for muscle spasms.     levothyroxine 75 MCG tablet  Commonly known as:  SYNTHROID, LEVOTHROID  Take 75 mcg by mouth daily before breakfast.           Objective:    BP 107/74 mmHg  Pulse 73  Temp(Src) 98.2 F (36.8 C) (Oral)  Ht 5\' 5"  (1.651 m)  Wt 158 lb (71.668 kg)  BMI 26.29 kg/m2  Wt Readings from Last 3 Encounters:  11/04/15 158 lb (71.668 kg)  09/09/15 154 lb (69.854 kg)  05/22/15 155 lb 6.4 oz (70.489 kg)    Physical Exam  Constitutional: She is oriented to person, place, and time. She appears well-developed and well-nourished. No distress.  Eyes: Conjunctivae and EOM are normal. Pupils are equal, round, and reactive to light.  Cardiovascular: Normal rate, regular rhythm, normal heart sounds and intact distal pulses.   No murmur heard. Pulmonary/Chest: Effort normal and breath sounds normal. No respiratory distress. She has no wheezes.  Musculoskeletal: Normal range of motion. She exhibits no edema.       Lumbar back: She exhibits tenderness. She exhibits normal range of motion, no bony tenderness, no edema and no deformity.  Neurological: She is alert and oriented to person, place, and time. Coordination normal.  Skin: Skin is warm and dry. No rash noted. She is not diaphoretic.  Psychiatric: She has a normal mood and affect. Her behavior is normal.  Nursing note  and vitals reviewed.     Assessment & Plan:   Problem List Items Addressed This Visit      Nervous and Auditory   Left sciatic nerve pain - Primary   Relevant Orders   MR Lumbar Spine Wo Contrast    Other Visit Diagnoses    Left-sided low back pain with left-sided sciatica        Has been going on for about a year, has done anti-inflammatories and NSAIDs and steroids and physical therapy    Relevant Orders    MR Lumbar Spine Wo Contrast       Follow up plan: Return if symptoms worsen or fail to improve.  Counseling provided for all of the vaccine components Orders Placed  This Encounter  Procedures  . MR Lumbar Spine Wo Contrast    Arville Care, MD [facility] 11/04/2015, 2:13 PM

## 2015-11-06 ENCOUNTER — Other Ambulatory Visit: Payer: Self-pay

## 2015-11-19 ENCOUNTER — Telehealth: Payer: Self-pay | Admitting: *Deleted

## 2015-11-19 NOTE — Telephone Encounter (Signed)
I do not see any MRI results as of yet.

## 2015-11-19 NOTE — Telephone Encounter (Signed)
Pt aware and she will call [facility] to get them to fax another copy over.

## 2015-11-20 ENCOUNTER — Telehealth: Payer: Self-pay

## 2015-11-20 DIAGNOSIS — M5416 Radiculopathy, lumbar region: Secondary | ICD-10-CM

## 2015-11-20 NOTE — Telephone Encounter (Signed)
Per Dr. Louanne Skyeettinger, patient was contacted and informed that she does not have some nerve impingement in the L5-S1 region and he recommends referral to ortho.  Patient was informed of results and would like to [facility].  Referral was sent up front.

## 2015-11-27 ENCOUNTER — Encounter: Payer: Self-pay | Admitting: Family Medicine

## 2015-12-13 ENCOUNTER — Other Ambulatory Visit: Payer: Self-pay | Admitting: Surgical

## 2015-12-27 NOTE — Patient Instructions (Signed)
Kimberly Shaw  12/27/2015   Your procedure is scheduled on:   01/01/2016    Report to [facility] Main  Entrance take [place]  elevators to 3rd floor to  Short Stay Center at  0630 AM.  Call this number if you have problems the morning of surgery [phone]   Remember: ONLY 1 PERSON MAY GO WITH YOU TO SHORT STAY TO GET  READY MORNING OF YOUR SURGERY.  Do not eat food or drink liquids :After Midnight.     Take these medicines the morning of surgery with A SIP OF WATER: Synthroid                                 You may not have any metal on your body including hair pins and              piercings  Do not wear jewelry, make-up, lotions, powders or perfumes, deodorant             Do not wear nail polish.  Do not shave  48 hours prior to surgery.                 Do not bring valuables to the hospital. [facility] IS NOT             RESPONSIBLE   FOR VALUABLES.  Contacts, dentures or bridgework may not be worn into surgery.  Leave suitcase in the car. After surgery it may be brought to your room.                   Practice coughing and deep breathing exercises, leg exercises               Please read over the following fact sheets you were given: _____________________________________________________________________             [facility] - Preparing for Surgery Before surgery, you can play an important role.  Because skin is not sterile, your skin needs to be as free of germs as possible.  You can reduce the number of germs on your skin by washing with CHG (chlorahexidine gluconate) soap before surgery.  CHG is an antiseptic cleaner which kills germs and bonds with the skin to continue killing germs even after washing. Please DO NOT use if you have an allergy to CHG or antibacterial soaps.  If your skin becomes reddened/irritated stop using the CHG and inform your nurse when you arrive at Short Stay. Do not shave (including legs and underarms) for at least 48  hours prior to the first CHG shower.  You may shave your face/neck. Please follow these instructions carefully:  1.  Shower with CHG Soap the night before surgery and the  morning of Surgery.  2.  If you choose to wash your hair, wash your hair first as usual with your  normal  shampoo.  3.  After you shampoo, rinse your hair and body thoroughly to remove the  shampoo.                           4.  Use CHG as you would any other liquid soap.  You can apply chg directly  to the skin and wash  Gently with a scrungie or clean washcloth.  5.  Apply the CHG Soap to your body ONLY FROM THE NECK DOWN.   Do not use on face/ open                           Wound or open sores. Avoid contact with eyes, ears mouth and genitals (private parts).                       Wash face,  Genitals (private parts) with your normal soap.             6.  Wash thoroughly, paying special attention to the area where your surgery  will be performed.  7.  Thoroughly rinse your body with warm water from the neck down.  8.  DO NOT shower/wash with your normal soap after using and rinsing off  the CHG Soap.                9.  Pat yourself dry with a clean towel.            10.  Wear clean pajamas.            11.  Place clean sheets on your bed the night of your first shower and do not  sleep with pets. Day of Surgery : Do not apply any lotions/deodorants the morning of surgery.  Please wear clean clothes to the hospital/surgery center.  FAILURE TO FOLLOW THESE INSTRUCTIONS MAY RESULT IN THE CANCELLATION OF YOUR SURGERY PATIENT SIGNATURE_________________________________  NURSE SIGNATURE__________________________________  ________________________________________________________________________  WHAT IS A BLOOD TRANSFUSION? Blood Transfusion Information  A transfusion is the replacement of blood or some of its parts. Blood is made up of multiple cells which provide different functions.  Red blood cells  carry oxygen and are used for blood loss replacement.  White blood cells fight against infection.  Platelets control bleeding.  Plasma helps clot blood.  Other blood products are available for specialized needs, such as hemophilia or other clotting disorders. BEFORE THE TRANSFUSION  Who gives blood for transfusions?   Healthy volunteers who are fully evaluated to make sure their blood is safe. This is blood bank blood. Transfusion therapy is the safest it has ever been in the practice of medicine. Before blood is taken from a donor, a complete history is taken to make sure that person has no history of diseases nor engages in risky social behavior (examples are intravenous drug use or sexual activity with multiple partners). The donor's travel history is screened to minimize risk of transmitting infections, such as malaria. The donated blood is tested for signs of infectious diseases, such as HIV and hepatitis. The blood is then tested to be sure it is compatible with you in order to minimize the chance of a transfusion reaction. If you or a relative donates blood, this is often done in anticipation of surgery and is not appropriate for emergency situations. It takes many days to process the donated blood. RISKS AND COMPLICATIONS Although transfusion therapy is very safe and saves many lives, the main dangers of transfusion include:   Getting an infectious disease.  Developing a transfusion reaction. This is an allergic reaction to something in the blood you were given. Every precaution is taken to prevent this. The decision to have a blood transfusion has been considered carefully by your caregiver before blood is given. Blood is not given unless the benefits outweigh  the risks. AFTER THE TRANSFUSION  Right after receiving a blood transfusion, you will usually feel much better and more energetic. This is especially true if your red blood cells have gotten low (anemic). The transfusion raises  the level of the red blood cells which carry oxygen, and this usually causes an energy increase.  The nurse administering the transfusion will monitor you carefully for complications. HOME CARE INSTRUCTIONS  No special instructions are needed after a transfusion. You may find your energy is better. Speak with your caregiver about any limitations on activity for underlying diseases you may have. SEEK MEDICAL CARE IF:   Your condition is not improving after your transfusion.  You develop redness or irritation at the intravenous (IV) site. SEEK IMMEDIATE MEDICAL CARE IF:  Any of the following symptoms occur over the next 12 hours:  Shaking chills.  You have a temperature by mouth above 102 F (38.9 C), not controlled by medicine.  Chest, back, or muscle pain.  People around you feel you are not acting correctly or are confused.  Shortness of breath or difficulty breathing.  Dizziness and fainting.  You get a rash or develop hives.  You have a decrease in urine output.  Your urine turns a dark color or changes to pink, red, or brown. Any of the following symptoms occur over the next 10 days:  You have a temperature by mouth above 102 F (38.9 C), not controlled by medicine.  Shortness of breath.  Weakness after normal activity.  The white part of the eye turns yellow (jaundice).  You have a decrease in the amount of urine or are urinating less often.  Your urine turns a dark color or changes to pink, red, or brown. Document Released: 05/08/2000 Document Revised: 08/03/2011 Document Reviewed: 12/26/2007 ExitCare Patient Information 2014 [place].  _______________________________________________________________________  Incentive Spirometer  An incentive spirometer is a tool that can help keep your lungs clear and active. This tool measures how well you are filling your lungs with each breath. Taking long deep breaths may help reverse or decrease the chance of  developing breathing (pulmonary) problems (especially infection) following:  A long period of time when you are unable to move or be active. BEFORE THE PROCEDURE   If the spirometer includes an indicator to show your best effort, your nurse or respiratory therapist will set it to a desired goal.  If possible, sit up straight or lean slightly forward. Try not to slouch.  Hold the incentive spirometer in an upright position. INSTRUCTIONS FOR USE  1. Sit on the edge of your bed if possible, or sit up as far as you can in bed or on a chair. 2. Hold the incentive spirometer in an upright position. 3. Breathe out normally. 4. Place the mouthpiece in your mouth and Malay your lips tightly around it. 5. Breathe in slowly and as deeply as possible, raising the piston or the ball toward the top of the column. 6. Hold your breath for 3-5 seconds or for as long as possible. Allow the piston or ball to fall to the bottom of the column. 7. Remove the mouthpiece from your mouth and breathe out normally. 8. Rest for a few seconds and repeat Steps 1 through 7 at least 10 times every 1-2 hours when you are awake. Take your time and take a few normal breaths between deep breaths. 9. The spirometer may include an indicator to show your best effort. Use the indicator as a goal to work  toward during each repetition. 10. After each set of 10 deep breaths, practice coughing to be sure your lungs are clear. If you have an incision (the cut made at the time of surgery), support your incision when coughing by placing a pillow or rolled up towels firmly against it. Once you are able to get out of bed, walk around indoors and cough well. You may stop using the incentive spirometer when instructed by your caregiver.  RISKS AND COMPLICATIONS  Take your time so you do not get dizzy or light-headed.  If you are in pain, you may need to take or ask for pain medication before doing incentive spirometry. It is harder to take a  deep breath if you are having pain. AFTER USE  Rest and breathe slowly and easily.  It can be helpful to keep track of a log of your progress. Your caregiver can provide you with a simple table to help with this. If you are using the spirometer at home, follow these instructions: [place] IF:   You are having difficultly using the spirometer.  You have trouble using the spirometer as often as instructed.  Your pain medication is not giving enough relief while using the spirometer.  You develop fever of 100.5 F (38.1 C) or higher. SEEK IMMEDIATE MEDICAL CARE IF:   You cough up bloody sputum that had not been present before.  You develop fever of 102 F (38.9 C) or greater.  You develop worsening pain at or near the incision site. MAKE SURE YOU:   Understand these instructions.  Will watch your condition.  Will get help right away if you are not doing well or get worse. Document Released: 09/21/2006 Document Revised: 08/03/2011 Document Reviewed: 11/22/2006 [facility] Patient Information 2014 [place], [place].   ________________________________________________________________________

## 2015-12-30 ENCOUNTER — Encounter (HOSPITAL_COMMUNITY)
Admission: RE | Admit: 2015-12-30 | Discharge: 2015-12-30 | Disposition: A | Discharge status: Home / Self Care | Payer: BLUE CROSS/BLUE SHIELD | Source: Ambulatory Visit | Attending: Orthopedic Surgery | Admitting: Orthopedic Surgery

## 2015-12-30 ENCOUNTER — Encounter (HOSPITAL_COMMUNITY): Payer: Self-pay

## 2015-12-30 ENCOUNTER — Ambulatory Visit (HOSPITAL_COMMUNITY)
Admission: RE | Admit: 2015-12-30 | Discharge: 2015-12-30 | Disposition: A | Discharge status: Home / Self Care | Payer: BLUE CROSS/BLUE SHIELD | Source: Ambulatory Visit | Attending: Surgical | Admitting: Surgical

## 2015-12-30 DIAGNOSIS — M4856XA Collapsed vertebra, not elsewhere classified, lumbar region, initial encounter for fracture: Secondary | ICD-10-CM | POA: Insufficient documentation

## 2015-12-30 DIAGNOSIS — M5136 Other intervertebral disc degeneration, lumbar region: Secondary | ICD-10-CM | POA: Insufficient documentation

## 2015-12-30 DIAGNOSIS — Z01818 Encounter for other preprocedural examination: Secondary | ICD-10-CM | POA: Insufficient documentation

## 2015-12-30 HISTORY — DX: Hypothyroidism, unspecified: E03.9

## 2015-12-30 HISTORY — DX: Gastro-esophageal reflux disease without esophagitis: K21.9

## 2015-12-30 LAB — CBC WITH DIFFERENTIAL/PLATELET
Basophils Absolute: 0 10*3/uL (ref 0.0–0.1)
Basophils Relative: 1 %
Eosinophils Absolute: 0.1 10*3/uL (ref 0.0–0.7)
Eosinophils Relative: 2 %
HCT: 40.3 % (ref 36.0–46.0)
Hemoglobin: 13.6 g/dL (ref 12.0–15.0)
Lymphocytes Relative: 34 %
Lymphs Abs: 2.5 10*3/uL (ref 0.7–4.0)
MCH: 30.6 pg (ref 26.0–34.0)
MCHC: 33.7 g/dL (ref 30.0–36.0)
MCV: 90.6 fL (ref 78.0–100.0)
Monocytes Absolute: 0.4 10*3/uL (ref 0.1–1.0)
Monocytes Relative: 5 %
Neutro Abs: 4.3 10*3/uL (ref 1.7–7.7)
Neutrophils Relative %: 58 %
Platelets: 244 10*3/uL (ref 150–400)
RBC: 4.45 MIL/uL (ref 3.87–5.11)
RDW: 12.6 % (ref 11.5–15.5)
WBC: 7.4 10*3/uL (ref 4.0–10.5)

## 2015-12-30 LAB — URINALYSIS, ROUTINE W REFLEX MICROSCOPIC
Bilirubin Urine: NEGATIVE
Glucose, UA: NEGATIVE mg/dL
Hgb urine dipstick: NEGATIVE
Ketones, ur: NEGATIVE mg/dL
Leukocytes, UA: NEGATIVE
Nitrite: NEGATIVE
Protein, ur: NEGATIVE mg/dL
Specific Gravity, Urine: 1.007 (ref 1.005–1.030)
pH: 6.5 (ref 5.0–8.0)

## 2015-12-30 LAB — PROTIME-INR
INR: 1
Prothrombin Time: 13.2 seconds (ref 11.4–15.2)

## 2015-12-30 LAB — COMPREHENSIVE METABOLIC PANEL
ALT: 15 U/L (ref 14–54)
AST: 15 U/L (ref 15–41)
Albumin: 4.3 g/dL (ref 3.5–5.0)
Alkaline Phosphatase: 69 U/L (ref 38–126)
Anion gap: 7 (ref 5–15)
BUN: 9 mg/dL (ref 6–20)
CO2: 25 mmol/L (ref 22–32)
Calcium: 8.8 mg/dL — ABNORMAL LOW (ref 8.9–10.3)
Chloride: 104 mmol/L (ref 101–111)
Creatinine, Ser: 0.57 mg/dL (ref 0.44–1.00)
GFR calc Af Amer: >60 mL/min (ref >60)
GFR calc non Af Amer: >60 mL/min (ref >60)
Glucose, Bld: 97 mg/dL (ref 65–99)
Potassium: 3.8 mmol/L (ref 3.5–5.1)
Sodium: 136 mmol/L (ref 135–145)
Total Bilirubin: 0.6 mg/dL (ref 0.3–1.2)
Total Protein: 7.4 g/dL (ref 6.5–8.1)

## 2015-12-30 LAB — SURGICAL PCR SCREEN
MRSA, PCR: NEGATIVE
Staphylococcus aureus: NEGATIVE

## 2015-12-30 LAB — APTT: aPTT: 30 seconds (ref 24–36)

## 2015-12-30 LAB — ABO/RH: ABO/RH(D): B POS

## 2015-12-30 LAB — PREGNANCY, URINE: Preg Test, Ur: NEGATIVE

## 2015-12-30 NOTE — Progress Notes (Signed)
Final EKG done 12/30/15- EPIC

## 2015-12-30 NOTE — H&P (Signed)
Kimberly Shaw is an 40 y.o. female.   Chief Complaint: low back pain HPI: The patient is a 40 year old female with the chief complaint of low back pain with radiation of pain into the left leg. She denies any injury. She has been having trouble with this for about a year. It has been worse in the past 4 months. MRI showed a disc herniation at L5-S1 on the left.   Past Medical History:  Diagnosis Date  . GERD (gastroesophageal reflux disease)   . Hypothyroidism   . Migraines   . Thyroid disease     Past Surgical History:  Procedure Laterality Date  . BREAST ENHANCEMENT SURGERY    . EYE SURGERY    . tummy tuck surgery      Social History:  reports that she has never smoked. She has never used smokeless tobacco. She reports that she drinks alcohol. She reports that she does not use drugs.  Allergies:  Allergies  Allergen Reactions  . Bee Venom     Swelling   . Mobic [Meloxicam] Other (See Comments)    Burning in throat      Current Outpatient Prescriptions:  .  levothyroxine (SYNTHROID, LEVOTHROID) 75 MCG tablet, Take 75 mcg by mouth daily before breakfast., Disp: , Rfl:   Results for orders placed or performed during the hospital encounter of 12/30/15 (from the past 48 hour(s))  Urinalysis, Routine w reflex microscopic (not at [facility])     Status: Abnormal   Collection Time: 12/30/15  8:10 AM  Result Value Ref Range   Color, Urine YELLOW YELLOW   APPearance CLOUDY (A) CLEAR   Specific Gravity, Urine 1.007 1.005 - 1.030   pH 6.5 5.0 - 8.0   Glucose, UA NEGATIVE NEGATIVE mg/dL   Hgb urine dipstick NEGATIVE NEGATIVE   Bilirubin Urine NEGATIVE NEGATIVE   Ketones, ur NEGATIVE NEGATIVE mg/dL   Protein, ur NEGATIVE NEGATIVE mg/dL   Nitrite NEGATIVE NEGATIVE   Leukocytes, UA NEGATIVE NEGATIVE    Comment: MICROSCOPIC NOT DONE ON URINES WITH NEGATIVE PROTEIN, BLOOD, LEUKOCYTES, NITRITE, OR GLUCOSE <1000 mg/dL.  Pregnancy, urine     Status: None   Collection Time: 12/30/15  8:10 AM   Result Value Ref Range   Preg Test, Ur NEGATIVE NEGATIVE    Comment:        THE SENSITIVITY OF THIS METHODOLOGY IS >20 mIU/mL.   Surgical pcr screen     Status: None   Collection Time: 12/30/15  8:40 AM  Result Value Ref Range   MRSA, PCR NEGATIVE NEGATIVE   Staphylococcus aureus NEGATIVE NEGATIVE    Comment:        The Xpert SA Assay (FDA approved for NASAL specimens in patients over 53 years of age), is one component of a comprehensive surveillance program.  Test performance has been validated by [facility] for patients greater than or equal to 74 year old. It is not intended to diagnose infection nor to guide or monitor treatment.   APTT     Status: None   Collection Time: 12/30/15  8:45 AM  Result Value Ref Range   aPTT 30 24 - 36 seconds  CBC WITH DIFFERENTIAL     Status: None   Collection Time: 12/30/15  8:45 AM  Result Value Ref Range   WBC 7.4 4.0 - 10.5 K/uL   RBC 4.45 3.87 - 5.11 MIL/uL   Hemoglobin 13.6 12.0 - 15.0 g/dL   HCT 40.3 36.0 - 46.0 %   MCV  90.6 78.0 - 100.0 fL   MCH 30.6 26.0 - 34.0 pg   MCHC 33.7 30.0 - 36.0 g/dL   RDW 12.6 11.5 - 15.5 %   Platelets 244 150 - 400 K/uL   Neutrophils Relative % 58 %   Neutro Abs 4.3 1.7 - 7.7 K/uL   Lymphocytes Relative 34 %   Lymphs Abs 2.5 0.7 - 4.0 K/uL   Monocytes Relative 5 %   Monocytes Absolute 0.4 0.1 - 1.0 K/uL   Eosinophils Relative 2 %   Eosinophils Absolute 0.1 0.0 - 0.7 K/uL   Basophils Relative 1 %   Basophils Absolute 0.0 0.0 - 0.1 K/uL  Comprehensive metabolic panel     Status: Abnormal   Collection Time: 12/30/15  8:45 AM  Result Value Ref Range   Sodium 136 135 - 145 mmol/L   Potassium 3.8 3.5 - 5.1 mmol/L   Chloride 104 101 - 111 mmol/L   CO2 25 22 - 32 mmol/L   Glucose, Bld 97 65 - 99 mg/dL   BUN 9 6 - 20 mg/dL   Creatinine, Ser 0.57 0.44 - 1.00 mg/dL   Calcium 8.8 (L) 8.9 - 10.3 mg/dL   Total Protein 7.4 6.5 - 8.1 g/dL   Albumin 4.3 3.5 - 5.0 g/dL   AST 15 15 - 41 U/L   ALT  15 14 - 54 U/L   Alkaline Phosphatase 69 38 - 126 U/L   Total Bilirubin 0.6 0.3 - 1.2 mg/dL   GFR calc non Af Amer >60 >60 mL/min   GFR calc Af Amer >60 >60 mL/min    Comment: (NOTE) The eGFR has been calculated using the CKD EPI equation. This calculation has not been validated in all clinical situations. eGFR's persistently <60 mL/min signify possible Chronic Kidney Disease.    Anion gap 7 5 - 15  Protime-INR     Status: None   Collection Time: 12/30/15  8:45 AM  Result Value Ref Range   Prothrombin Time 13.2 11.4 - 15.2 seconds   INR 1.00   Type and screen Order type and screen if day of surgery is less than 15 days from draw of preadmission visit or order morning of surgery if day of surgery is greater than 6 days from preadmission visit.     Status: None   Collection Time: 12/30/15  8:45 AM  Result Value Ref Range   ABO/RH(D) B POS    Antibody Screen NEG    Sample Expiration 01/13/2016    Extend sample reason NO TRANSFUSIONS OR PREGNANCY IN THE PAST 3 MONTHS    Dg Chest 2 View  Result Date: 12/30/2015 CLINICAL DATA:  Preop chest radiograph (lumbar spine surgery) EXAM: CHEST  2 VIEW COMPARISON:  None. FINDINGS: Normal cardiac silhouette and mediastinal contours. No focal airspace opacities. No pleural effusion or pneumothorax. There is a minimal amount of pleural parenchymal thickening about the peripheral aspect the right minor fissure. No evidence of edema. No acute osseus abnormalities. IMPRESSION: No acute cardiopulmonary disease. Electronically Signed   By: Sandi Mariscal M.D.   On: 12/30/2015 09:34   Dg Lumbar Spine 2-3 Views  Result Date: 12/30/2015 CLINICAL DATA:  Preoperative evaluation for L4-L5 micro discectomy. EXAM: LUMBAR SPINE - 2-3 VIEW COMPARISON:  Lumbar spine radiographs - 08/30/2015 FINDINGS: There are 5 non rib-bearing lumbar type vertebral bodies. Normal alignment of the lumbar spine. No anterolisthesis or retrolisthesis. Mild (< 25%) compression deformities  involving the superior endplates of the L1 and L3 vertebral bodies  are unchanged since 08/2015 examination. Remaining lumbar vertebral body heights appear preserved. Mild multilevel lumbar spine DDD, worse at T12-L1 and L2-L3 with disc space height loss, endplate irregularity and sclerosis. Limited visualization of the bilateral SI joints is normal. Large colonic stool burden. Regional soft tissues appear otherwise normal. IMPRESSION: 1. Mild multilevel lumbar spine DDD. 2. Unchanged mild (under 25%) compression deformities involving the superior endplates of the L1 and L3 vertebral bodies, similar to the 08/2015 examination. Electronically Signed   By: Sandi Mariscal M.D.   On: 12/30/2015 09:32    Review of Systems  Constitutional: Negative.   HENT: Negative.   Eyes: Negative.   Respiratory: Negative.   Cardiovascular: Negative.   Gastrointestinal: Negative.   Genitourinary: Negative.   Musculoskeletal: Positive for back pain and myalgias. Negative for falls, joint pain and neck pain.  Skin: Negative.   Neurological: Positive for tingling. Negative for dizziness, tremors, speech change, focal weakness, seizures and loss of consciousness.  Endo/Heme/Allergies: Negative.   Psychiatric/Behavioral: Negative.     Last menstrual period 12/11/2015. Physical Exam  Constitutional: She is oriented to person, place, and time. She appears well-developed and well-nourished. No distress.  HENT:  Head: Normocephalic and atraumatic.  Right Ear: External ear normal.  Left Ear: External ear normal.  Nose: Nose normal.  Mouth/Throat: Oropharynx is clear and moist.  Eyes: Conjunctivae and EOM are normal.  Neck: Normal range of motion. Neck supple.  Cardiovascular: Normal rate, regular rhythm, normal heart sounds and intact distal pulses.   No murmur heard. Respiratory: Effort normal and breath sounds normal. No respiratory distress. She has no wheezes.  GI: Bowel sounds are normal. She exhibits no  distension. There is no tenderness.  Musculoskeletal:       Right hip: Normal.       Left hip: Normal.       Right knee: Normal.       Left knee: Normal.       Lumbar back: She exhibits pain and spasm. She exhibits no bony tenderness.  Neurological: She is alert and oriented to person, place, and time. She has normal strength and normal reflexes. No sensory deficit.  Skin: No rash noted. She is not diaphoretic. No erythema.  Psychiatric: She has a normal mood and affect. Her behavior is normal.     Assessment/Plan Lumbar disc herniation L5-S1 left She needs a lumbar hemilaminectomy and microdiscectomy L5-S1 on the left. Risks and benefits of the procedure discussued with the patient by Dr. Latanya Maudlin.   H&P performed by Dr. Latanya Maudlin Documented by Ardeen Jourdain, PA-C  Margan Elias Ander Purpura, PA-C 12/30/2015, 12:39 PM

## 2015-12-31 NOTE — Anesthesia Preprocedure Evaluation (Signed)
Anesthesia Evaluation  Patient identified by MRN, date of birth, ID band Patient awake    Reviewed: Allergy & Precautions, H&P , NPO status , Patient's Chart, lab work & pertinent test results  Airway Mallampati: II  TM Distance: >3 FB Neck ROM: full    Dental no notable dental hx. (+) Dental Advisory Given, Teeth Intact   Pulmonary neg pulmonary ROS,    Pulmonary exam normal breath sounds clear to auscultation       Cardiovascular Exercise Tolerance: Good negative cardio ROS Normal cardiovascular exam Rhythm:regular Rate:Normal     Neuro/Psych negative neurological ROS  negative psych ROS   GI/Hepatic negative GI ROS, Neg liver ROS,   Endo/Other  negative endocrine ROSHypothyroidism   Renal/GU negative Renal ROS  negative genitourinary   Musculoskeletal   Abdominal   Peds  Hematology negative hematology ROS (+)   Anesthesia Other Findings   Reproductive/Obstetrics negative OB ROS                             Anesthesia Physical Anesthesia Plan  ASA: II  Anesthesia Plan: General   Post-op Pain Management:    Induction: Intravenous  Airway Management Planned: Oral ETT  Additional Equipment:   Intra-op Plan:   Post-operative Plan: Extubation in OR  Informed Consent: I have reviewed the patients History and Physical, chart, labs and discussed the procedure including the risks, benefits and alternatives for the proposed anesthesia with the patient or authorized representative who has indicated his/her understanding and acceptance.   Dental Advisory Given  Plan Discussed with: CRNA  Anesthesia Plan Comments:         Anesthesia Quick Evaluation

## 2016-01-01 ENCOUNTER — Ambulatory Visit (HOSPITAL_COMMUNITY): Payer: BLUE CROSS/BLUE SHIELD

## 2016-01-01 ENCOUNTER — Encounter (HOSPITAL_COMMUNITY)
Admission: RE | Disposition: A | Discharge status: Home / Self Care | Payer: Self-pay | Source: Ambulatory Visit | Attending: Orthopedic Surgery

## 2016-01-01 ENCOUNTER — Observation Stay (HOSPITAL_COMMUNITY)
Admission: RE | Admit: 2016-01-01 | Discharge: 2016-01-02 | Disposition: A | Discharge status: Home / Self Care | Payer: BLUE CROSS/BLUE SHIELD | Source: Ambulatory Visit | Attending: Orthopedic Surgery | Admitting: Orthopedic Surgery

## 2016-01-01 ENCOUNTER — Ambulatory Visit (HOSPITAL_COMMUNITY): Payer: BLUE CROSS/BLUE SHIELD | Admitting: Anesthesiology

## 2016-01-01 ENCOUNTER — Encounter (HOSPITAL_COMMUNITY): Payer: Self-pay | Admitting: *Deleted

## 2016-01-01 DIAGNOSIS — M5127 Other intervertebral disc displacement, lumbosacral region: Secondary | ICD-10-CM | POA: Diagnosis not present

## 2016-01-01 DIAGNOSIS — Z419 Encounter for procedure for purposes other than remedying health state, unspecified: Secondary | ICD-10-CM

## 2016-01-01 DIAGNOSIS — M4807 Spinal stenosis, lumbosacral region: Secondary | ICD-10-CM | POA: Diagnosis not present

## 2016-01-01 DIAGNOSIS — M545 Low back pain: Secondary | ICD-10-CM | POA: Diagnosis present

## 2016-01-01 DIAGNOSIS — M48062 Spinal stenosis, lumbar region with neurogenic claudication: Secondary | ICD-10-CM | POA: Diagnosis present

## 2016-01-01 HISTORY — PX: HEMI-MICRODISCECTOMY LUMBAR LAMINECTOMY LEVEL 1: SHX5846

## 2016-01-01 LAB — TYPE AND SCREEN
ABO/RH(D): B POS
Antibody Screen: NEGATIVE

## 2016-01-01 SURGERY — HEMI-MICRODISCECTOMY LUMBAR LAMINECTOMY LEVEL 1
Anesthesia: General | Site: Back | Laterality: Left

## 2016-01-01 MED ORDER — SODIUM CHLORIDE 0.9 % IR SOLN
Status: AC
  Filled 2016-01-01: qty 1

## 2016-01-01 MED ORDER — SUGAMMADEX SODIUM 200 MG/2ML IV SOLN
INTRAVENOUS | Status: AC
  Filled 2016-01-01: qty 2

## 2016-01-01 MED ORDER — BISACODYL 5 MG PO TBEC: 5.0000 mg | DELAYED_RELEASE_TABLET | Freq: Every day | ORAL | Status: DC | PRN

## 2016-01-01 MED ORDER — LACTATED RINGERS IV SOLN
INTRAVENOUS | Status: DC
  Administered 2016-01-01: 100 mL/h via INTRAVENOUS
  Administered 2016-01-01: 21:00:00 via INTRAVENOUS

## 2016-01-01 MED ORDER — LIDOCAINE HCL (CARDIAC) 20 MG/ML IV SOLN
INTRAVENOUS | Status: AC
  Filled 2016-01-01: qty 5

## 2016-01-01 MED ORDER — ACETAMINOPHEN 10 MG/ML IV SOLN
1000.0000 mg | Freq: Once | INTRAVENOUS | Status: AC
  Administered 2016-01-01: 1000 mg via INTRAVENOUS

## 2016-01-01 MED ORDER — METHOCARBAMOL 500 MG PO TABS: 500.0000 mg | ORAL_TABLET | Freq: Four times a day (QID) | ORAL | 0 refills | Status: AC | PRN

## 2016-01-01 MED ORDER — SUGAMMADEX SODIUM 200 MG/2ML IV SOLN
INTRAVENOUS | Status: DC | PRN
  Administered 2016-01-01: 200 mg via INTRAVENOUS

## 2016-01-01 MED ORDER — BUPIVACAINE LIPOSOME 1.3 % IJ SUSP
20.0000 mL | Freq: Once | INTRAMUSCULAR | Status: AC
  Administered 2016-01-01: 20 mL
  Filled 2016-01-01: qty 20

## 2016-01-01 MED ORDER — HYDROMORPHONE HCL 1 MG/ML IJ SOLN: 0.5000 mg | INTRAMUSCULAR | Status: DC | PRN

## 2016-01-01 MED ORDER — HYDROMORPHONE HCL 1 MG/ML IJ SOLN
0.2500 mg | INTRAMUSCULAR | Status: DC | PRN
  Administered 2016-01-01 (×2): 0.5 mg via INTRAVENOUS

## 2016-01-01 MED ORDER — BACITRACIN-NEOMYCIN-POLYMYXIN 400-5-5000 EX OINT
TOPICAL_OINTMENT | CUTANEOUS | Status: AC
  Filled 2016-01-01: qty 1

## 2016-01-01 MED ORDER — MENTHOL 3 MG MT LOZG: 1.0000 | LOZENGE | OROMUCOSAL | Status: DC | PRN

## 2016-01-01 MED ORDER — FENTANYL CITRATE (PF) 100 MCG/2ML IJ SOLN
INTRAMUSCULAR | Status: AC
  Filled 2016-01-01: qty 2

## 2016-01-01 MED ORDER — ONDANSETRON HCL 4 MG/2ML IJ SOLN: 4.0000 mg | INTRAMUSCULAR | Status: DC | PRN

## 2016-01-01 MED ORDER — BUPIVACAINE-EPINEPHRINE (PF) 0.5% -1:200000 IJ SOLN
INTRAMUSCULAR | Status: DC | PRN
  Administered 2016-01-01: 20 mL

## 2016-01-01 MED ORDER — OXYCODONE-ACETAMINOPHEN 5-325 MG PO TABS: 1.0000 | ORAL_TABLET | Freq: Four times a day (QID) | ORAL | 0 refills | Status: AC | PRN

## 2016-01-01 MED ORDER — ACETAMINOPHEN 325 MG PO TABS: 650.0000 mg | ORAL_TABLET | ORAL | Status: DC | PRN

## 2016-01-01 MED ORDER — ROCURONIUM BROMIDE 100 MG/10ML IV SOLN
INTRAVENOUS | Status: AC
  Filled 2016-01-01: qty 1

## 2016-01-01 MED ORDER — ONDANSETRON HCL 4 MG/2ML IJ SOLN
INTRAMUSCULAR | Status: DC | PRN
  Administered 2016-01-01: 4 mg via INTRAVENOUS

## 2016-01-01 MED ORDER — MIDAZOLAM HCL 5 MG/5ML IJ SOLN
INTRAMUSCULAR | Status: DC | PRN
  Administered 2016-01-01: 2 mg via INTRAVENOUS

## 2016-01-01 MED ORDER — LIDOCAINE HCL (CARDIAC) 20 MG/ML IV SOLN
INTRAVENOUS | Status: DC | PRN
  Administered 2016-01-01: 60 mg via INTRATRACHEAL

## 2016-01-01 MED ORDER — MIDAZOLAM HCL 2 MG/2ML IJ SOLN
INTRAMUSCULAR | Status: AC
  Filled 2016-01-01: qty 2

## 2016-01-01 MED ORDER — DEXAMETHASONE SODIUM PHOSPHATE 10 MG/ML IJ SOLN
INTRAMUSCULAR | Status: AC
  Filled 2016-01-01: qty 1

## 2016-01-01 MED ORDER — CEFAZOLIN SODIUM-DEXTROSE 2-4 GM/100ML-% IV SOLN
2.0000 g | INTRAVENOUS | Status: AC
  Administered 2016-01-01: 2 g via INTRAVENOUS
  Filled 2016-01-01: qty 100

## 2016-01-01 MED ORDER — ONDANSETRON HCL 4 MG/2ML IJ SOLN
INTRAMUSCULAR | Status: AC
  Filled 2016-01-01: qty 2

## 2016-01-01 MED ORDER — HYDROCODONE-ACETAMINOPHEN 5-325 MG PO TABS
1.0000 | ORAL_TABLET | ORAL | Status: DC | PRN
  Filled 2016-01-01: qty 1

## 2016-01-01 MED ORDER — HYDROMORPHONE HCL 1 MG/ML IJ SOLN
INTRAMUSCULAR | Status: AC
  Filled 2016-01-01: qty 1

## 2016-01-01 MED ORDER — ACETAMINOPHEN 650 MG RE SUPP
650.0000 mg | RECTAL | Status: DC | PRN
Start: 2016-01-01 — End: 2016-01-02

## 2016-01-01 MED ORDER — ACETAMINOPHEN 10 MG/ML IV SOLN
INTRAVENOUS | Status: AC
  Filled 2016-01-01: qty 100

## 2016-01-01 MED ORDER — PHENOL 1.4 % MT LIQD
1.0000 | OROMUCOSAL | Status: DC | PRN
Start: 2016-01-01 — End: 2016-01-02
  Filled 2016-01-01: qty 177

## 2016-01-01 MED ORDER — FLEET ENEMA 7-19 GM/118ML RE ENEM: 1.0000 | ENEMA | Freq: Once | RECTAL | Status: DC | PRN

## 2016-01-01 MED ORDER — BACITRACIN-NEOMYCIN-POLYMYXIN 400-5-5000 EX OINT
TOPICAL_OINTMENT | CUTANEOUS | Status: DC | PRN
  Administered 2016-01-01: 1 via TOPICAL

## 2016-01-01 MED ORDER — FENTANYL CITRATE (PF) 250 MCG/5ML IJ SOLN
INTRAMUSCULAR | Status: DC | PRN
  Administered 2016-01-01 (×5): 50 ug via INTRAVENOUS

## 2016-01-01 MED ORDER — BUPIVACAINE LIPOSOME 1.3 % IJ SUSP
20.0000 mL | Freq: Once | INTRAMUSCULAR | Status: DC
  Filled 2016-01-01: qty 20

## 2016-01-01 MED ORDER — LACTATED RINGERS IV SOLN
INTRAVENOUS | Status: DC
  Administered 2016-01-01: 08:00:00 via INTRAVENOUS

## 2016-01-01 MED ORDER — PROPOFOL 10 MG/ML IV BOLUS
INTRAVENOUS | Status: DC | PRN
  Administered 2016-01-01: 150 mg via INTRAVENOUS

## 2016-01-01 MED ORDER — BUPIVACAINE-EPINEPHRINE (PF) 0.5% -1:200000 IJ SOLN
INTRAMUSCULAR | Status: AC
  Filled 2016-01-01: qty 30

## 2016-01-01 MED ORDER — FENTANYL CITRATE (PF) 250 MCG/5ML IJ SOLN
INTRAMUSCULAR | Status: AC
  Filled 2016-01-01: qty 5

## 2016-01-01 MED ORDER — LEVOTHYROXINE SODIUM 75 MCG PO TABS
75.0000 ug | ORAL_TABLET | Freq: Every day | ORAL | Status: DC
  Administered 2016-01-02: 75 ug via ORAL
  Filled 2016-01-01: qty 1

## 2016-01-01 MED ORDER — CEFAZOLIN IN D5W 1 GM/50ML IV SOLN
1.0000 g | Freq: Three times a day (TID) | INTRAVENOUS | Status: AC
  Administered 2016-01-01 – 2016-01-02 (×3): 1 g via INTRAVENOUS
  Filled 2016-01-01 (×3): qty 50

## 2016-01-01 MED ORDER — CEFAZOLIN SODIUM-DEXTROSE 2-4 GM/100ML-% IV SOLN
INTRAVENOUS | Status: AC
  Filled 2016-01-01: qty 100

## 2016-01-01 MED ORDER — METHOCARBAMOL 1000 MG/10ML IJ SOLN
500.0000 mg | Freq: Four times a day (QID) | INTRAVENOUS | Status: DC | PRN
  Administered 2016-01-01: 500 mg via INTRAVENOUS
  Filled 2016-01-01: qty 550
  Filled 2016-01-01: qty 5

## 2016-01-01 MED ORDER — LACTATED RINGERS IV SOLN: INTRAVENOUS | Status: DC

## 2016-01-01 MED ORDER — DEXAMETHASONE SODIUM PHOSPHATE 10 MG/ML IJ SOLN
INTRAMUSCULAR | Status: DC | PRN
  Administered 2016-01-01: 10 mg via INTRAVENOUS

## 2016-01-01 MED ORDER — PROPOFOL 10 MG/ML IV BOLUS
INTRAVENOUS | Status: AC
  Filled 2016-01-01: qty 20

## 2016-01-01 MED ORDER — ROCURONIUM BROMIDE 100 MG/10ML IV SOLN
INTRAVENOUS | Status: DC | PRN
Start: 2016-01-01 — End: 2016-01-01
  Administered 2016-01-01: 50 mg via INTRAVENOUS
  Administered 2016-01-01: 5 mg via INTRAVENOUS

## 2016-01-01 MED ORDER — OXYCODONE-ACETAMINOPHEN 5-325 MG PO TABS
1.0000 | ORAL_TABLET | ORAL | Status: DC | PRN
  Administered 2016-01-01 – 2016-01-02 (×6): 1 via ORAL
  Filled 2016-01-01 (×6): qty 1
  Filled 2016-01-01: qty 2

## 2016-01-01 MED ORDER — POLYMYXIN B SULFATE 500000 UNITS IJ SOLR
INTRAMUSCULAR | Status: DC | PRN
  Administered 2016-01-01: 500 mL

## 2016-01-01 MED ORDER — POLYETHYLENE GLYCOL 3350 17 G PO PACK: 17.0000 g | PACK | Freq: Every day | ORAL | Status: DC | PRN

## 2016-01-01 MED ORDER — METHOCARBAMOL 500 MG PO TABS
500.0000 mg | ORAL_TABLET | Freq: Four times a day (QID) | ORAL | Status: DC | PRN
  Administered 2016-01-02: 500 mg via ORAL
  Filled 2016-01-01: qty 1

## 2016-01-01 SURGICAL SUPPLY — 42 items
BAG ZIPLOCK 12X15 (MISCELLANEOUS) IMPLANT
CLEANER TIP ELECTROSURG 2X2 (MISCELLANEOUS) ×3 IMPLANT
DRAIN PENROSE 18X1/4 LTX STRL (WOUND CARE) IMPLANT
DRAPE MICROSCOPE LEICA (MISCELLANEOUS) ×3 IMPLANT
DRAPE SURG 17X11 SM STRL (DRAPES) ×3 IMPLANT
DRSG ADAPTIC 3X8 NADH LF (GAUZE/BANDAGES/DRESSINGS) ×3 IMPLANT
DRSG PAD ABDOMINAL 8X10 ST (GAUZE/BANDAGES/DRESSINGS) ×12 IMPLANT
DURAPREP 26ML APPLICATOR (WOUND CARE) ×3 IMPLANT
ELECT BLADE TIP CTD 4 INCH (ELECTRODE) ×3 IMPLANT
ELECT REM PT RETURN 9FT ADLT (ELECTROSURGICAL) ×3
ELECTRODE REM PT RTRN 9FT ADLT (ELECTROSURGICAL) ×1 IMPLANT
GAUZE SPONGE 4X4 12PLY STRL (GAUZE/BANDAGES/DRESSINGS) ×3 IMPLANT
GLOVE BIOGEL PI IND STRL 6.5 (GLOVE) ×3 IMPLANT
GLOVE BIOGEL PI IND STRL 8 (GLOVE) ×1 IMPLANT
GLOVE BIOGEL PI INDICATOR 6.5 (GLOVE) ×6
GLOVE BIOGEL PI INDICATOR 8 (GLOVE) ×2
GLOVE ECLIPSE 8.0 STRL XLNG CF (GLOVE) ×3 IMPLANT
GLOVE SURG SS PI 6.5 STRL IVOR (GLOVE) ×3 IMPLANT
GOWN STRL REUS W/ TWL LRG LVL3 (GOWN DISPOSABLE) ×1 IMPLANT
GOWN STRL REUS W/TWL LRG LVL3 (GOWN DISPOSABLE) ×2
GOWN STRL REUS W/TWL XL LVL3 (GOWN DISPOSABLE) ×6 IMPLANT
KIT BASIN OR (CUSTOM PROCEDURE TRAY) ×3 IMPLANT
KIT POSITIONING SURG ANDREWS (MISCELLANEOUS) ×3 IMPLANT
MANIFOLD NEPTUNE II (INSTRUMENTS) ×3 IMPLANT
NEEDLE SPNL 18GX3.5 QUINCKE PK (NEEDLE) ×6 IMPLANT
PACK LAMINECTOMY ORTHO (CUSTOM PROCEDURE TRAY) ×3 IMPLANT
PAD ABD 8X10 STRL (GAUZE/BANDAGES/DRESSINGS) ×3 IMPLANT
PATTIES SURGICAL .5 X.5 (GAUZE/BANDAGES/DRESSINGS) IMPLANT
PATTIES SURGICAL .75X.75 (GAUZE/BANDAGES/DRESSINGS) ×3 IMPLANT
PATTIES SURGICAL 1X1 (DISPOSABLE) ×3 IMPLANT
PIN SAFETY NICK PLATE  2 MED (MISCELLANEOUS)
PIN SAFETY NICK PLATE 2 MED (MISCELLANEOUS) IMPLANT
SPONGE LAP 4X18 X RAY DECT (DISPOSABLE) ×3 IMPLANT
STAPLER VISISTAT 35W (STAPLE) ×3 IMPLANT
SUT VIC AB 1 CT1 27 (SUTURE) ×2
SUT VIC AB 1 CT1 27XBRD ANTBC (SUTURE) ×1 IMPLANT
SUT VIC AB 2-0 CT1 27 (SUTURE) ×2
SUT VIC AB 2-0 CT1 TAPERPNT 27 (SUTURE) ×1 IMPLANT
SYR 20CC LL (SYRINGE) ×6 IMPLANT
TAPE CLOTH SURG 4X10 WHT LF (GAUZE/BANDAGES/DRESSINGS) ×3 IMPLANT
TOWEL OR 17X26 10 PK STRL BLUE (TOWEL DISPOSABLE) ×3 IMPLANT
TOWEL OR NON WOVEN STRL DISP B (DISPOSABLE) ×3 IMPLANT

## 2016-01-01 NOTE — Brief Op Note (Signed)
MULTIPLE AREAS NOTED ON SKIN REDDENED NO SWELLING NOTED. ARES INVOLVED REFER TO PRESSURE FROM POSITIONING, BISS MONITOR, FACE PILLOW, CHEST PILLOW EKG STICKERS AND BEHR HUGGER UPPER BODY WARMER TAPE.

## 2016-01-01 NOTE — Interval H&P Note (Signed)
History and Physical Interval Note:  01/01/2016 8:19 AM  Kimberly Shaw  has presented today for surgery, with the diagnosis of LUMBAR HNP L5-S1 LEFT   The various methods of treatment have been discussed with the patient and family. After consideration of risks, benefits and other options for treatment, the patient has consented to a L-5-S-1 laminectomy. as a surgical intervention .  The patient's history has been reviewed, patient examined, no change in status, stable for surgery.  I have reviewed the patient's chart and labs.  Questions were answered to the patient's satisfaction.     Kennard Fildes A

## 2016-01-01 NOTE — Transfer of Care (Signed)
Immediate Anesthesia Transfer of Care Note  Patient: Kimberly Shaw  Procedure(s) Performed: Procedure(s): CENTRAL DECOMPRESSION L5,S1 FOR STENOSIS, FORAMINOTOMY S1 NERVE ROOT ON LEFT, MICRODISCECTOMY L5,S1 (Left)  Patient Location: PACU  Anesthesia Type:General  Level of Consciousness: awake, alert , oriented and patient cooperative  Airway & Oxygen Therapy: Patient Spontanous Breathing and Patient connected to face mask oxygen  Post-op Assessment: Report given to RN, Post -op Vital signs reviewed and stable and Patient moving all extremities X 4  Post vital signs: Reviewed and stable  Last Vitals:  Vitals:   01/01/16 0612  BP: 107/71  Pulse: 64  Resp: 16  Temp: 36.7 C    Last Pain:  Vitals:   01/01/16 0637  TempSrc:   PainSc: 5          Complications: No apparent anesthesia complications

## 2016-01-01 NOTE — Brief Op Note (Signed)
01/01/2016  10:29 AM  PATIENT:  Dara LordsPeggy Guerreiro  40 y.o. female  PRE-OPERATIVE DIAGNOSIS:  LUMBAR HNP L5-S1 LEFT and Spinal Stenosis  POST-OPERATIVE DIAGNOSIS:  LUMBAR HNP L5-S1 LEFT  And Spinal Stenosis  PROCEDURE:  Central Decompressive Laminectomy at L-5-S-1 for Stenosis and Microdiscectomy for HNP on the left.and Foraminotomy for S-1 nerve root. SURGEON:  Surgeon(s) and Role:    * Ranee Gosselinonald Jadarrius Maselli, MD - Primary  PHYSICIAN ASSISTANT: Dimitri PedAmber Constable PA  ASSISTANTS: Dimitri PedAmber Constable PA  ANESTHESIA:   general  EBL:  Total I/O In: -  Out: 50 [Blood:50]  BLOOD ADMINISTERED:none  DRAINS: none   LOCAL MEDICATIONS USED:  MARCAINE 20cc of 0.50% with Epinephrine at start of the case and 20cc of Exparel at the end of the case.    SPECIMEN:  Source of Specimen:  L-5-S-1  DISPOSITION OF SPECIMEN:  PATHOLOGY  COUNTS:  YES  TOURNIQUET:  * No tourniquets in log *  DICTATION: .Other Dictation: Dictation Number [id number]  PLAN OF CARE: Admit for overnight observation  PATIENT DISPOSITION:  Stable in OR   Delay start of Pharmacological VTE agent (>24hrs) due to surgical blood loss or risk of bleeding: yes

## 2016-01-01 NOTE — Anesthesia Postprocedure Evaluation (Signed)
Anesthesia Post Note  Patient: Kimberly Shaw  Procedure(s) Performed: Procedure(s) (LRB): CENTRAL DECOMPRESSION L5,S1 FOR STENOSIS, FORAMINOTOMY S1 NERVE ROOT ON LEFT, MICRODISCECTOMY L5,S1 (Left)  Patient location during evaluation: PACU Anesthesia Type: General Level of consciousness: awake and alert Pain management: pain level controlled Vital Signs Assessment: post-procedure vital signs reviewed and stable Respiratory status: spontaneous breathing, nonlabored ventilation, respiratory function stable and patient connected to nasal cannula oxygen Cardiovascular status: blood pressure returned to baseline and stable Postop Assessment: no signs of nausea or vomiting Anesthetic complications: no    Last Vitals:  Vitals:   01/01/16 1130 01/01/16 1140  BP: 114/76 103/63  Pulse: 80 78  Resp: 14 14  Temp:  36.8 C    Last Pain:  Vitals:   01/01/16 1125  TempSrc:   PainSc: 4                  Kaiea Esselman L

## 2016-01-01 NOTE — Anesthesia Procedure Notes (Signed)
Procedure Name: Intubation Date/Time: 01/01/2016 8:41 AM Performed by: Delphia GratesHANDLER, Kimberly Shaw Pre-anesthesia Checklist: Emergency Drugs available, Suction available, Patient identified and Patient being monitored Patient Re-evaluated:Patient Re-evaluated prior to inductionOxygen Delivery Method: Circle system utilized Preoxygenation: Pre-oxygenation with 100% oxygen Intubation Type: IV induction Ventilation: Mask ventilation without difficulty Laryngoscope Size: Mac and 4 Grade View: Grade I Tube type: Oral Tube size: 7.5 mm Number of attempts: 1 Airway Equipment and Method: Stylet Placement Confirmation: ETT inserted through vocal cords under direct vision,  breath sounds checked- equal and bilateral and positive ETCO2 Secured at: 21 cm Tube secured with: Tape Dental Injury: Teeth and Oropharynx as per pre-operative assessment

## 2016-01-01 NOTE — Discharge Instructions (Signed)
For the first three days, remove your dressing, tape a piece of saran wrap over your incision. Take your shower, then remove the saran wrap and put a clean dressing on. Change your dressing daily with gauze and paper tape. You may shower starting Friday. No lifting. No driving while taking pain medications.  Call Dr. Darrelyn HillockGioffre if any wound complications or temperature of 101 degrees F or over.  Call the office for an appointment to see Dr. Darrelyn HillockGioffre in two weeks: [phone] and ask for Dr. Jeannetta EllisGioffre's nurse, Mackey Birchwoodammy Johnson.

## 2016-01-01 NOTE — Op Note (Signed)
Kimberly Shaw, Kimberly Shaw                ACCOUNT NO.:  [id number]  MEDICAL RECORD NO.:  [id number]  LOCATION:  [facility]                         FACILITY:  [facility]  PHYSICIAN:  Georges Lynch. Aarohi Redditt, M.D.DATE OF BIRTH:  [birth year]  DATE OF PROCEDURE:  01/01/2016 DATE OF DISCHARGE:                              OPERATIVE REPORT   SURGEON:  Georges Lynch. Darrelyn Hillock, M.D.  ASSISTANT:  Dimitri Ped, [place]  PREOPERATIVE DIAGNOSES: 1. Spinal stenosis at L5-S1 with severe lateral recess stenosis. 2. Herniated lumbar disk at L5-S1 on the left.  Note all her pain was     on the left along the S1 nerve root distribution. 3. Foraminal stenosis for the S1 root.  POSTOPERATIVE DIAGNOSES: 1. Spinal stenosis at L5-S1 with severe lateral recess stenosis. 2. Herniated lumbar disk at L5-S1 on the left.  Note all her pain was     on the left along the S1 nerve root distribution. 3. Foraminal stenosis for the S1 root.  OPERATIONS: 1. Central decompressive lumbar laminectomy at L5-S1 for stenosis. 2. Microdiskectomy at L5-S1 for a large herniated lumbar disk. 3. Foraminotomy for the S1 root on the left.  DESCRIPTION OF PROCEDURE:  Under general anesthesia, routine orthopedic prep and draping of the lower back was carried out.  I marked the appropriate left side of the back in the holding area.  I also did the appropriate time-out.  At this time, after sterile prep and draping was carried out, the patient had 2 g of IV Ancef.  Two needles were placed in the back for localization purposes and x-ray was taken.  Incision then was made over the L5-S1 interspace in usual fashion.  Bleeders were identified and cauterized.  Note, the incision was extended proximally and distally.  The muscle was stripped from the lamina and spinous process in usual fashion.  Instruments were placed, inserted again, x- ray was taken.  Following that, the [employer] retractors were inserted. I then carried out a central decompressive lumbar  laminectomy because the lamina was extremely tight.  She had marked shingling effect, recess was tight as well on the left.  At this time, we went down to remove the portion of the spinous process at L5.  I went down and did our laminectomy in usual fashion.  We went more central into the left and spared the right side, which is the asymptomatic side.  I then went out first and thoroughly decompressed the lateral recess, which was extremely tight.  I then removed the ligamentum flavum and microscope was used.  After doing that, I identified the S1 nerve root, which was quite swollen.  I gently retracted that, went out further in the recess to decompress that.  I cauterized the lateral recess veins.  I went down the foramen of the S1 root first and decompressed that.  I then gently retracted the root, needle was placed in the disk space, x-ray was taken.  We then made a cruciate incision in the posterior longitudinal ligament and did a microdiskectomy.  At the beginning, we removed one large fragment.  We then utilized the Epstein curettes to further compress the remaining disk down in the space and removed the  disk. When the procedure was completed, I was able to easily pass the hockey- stick down the foramen for the S1 root.  We had a nice thorough decompression.  We utilized the nerve hook as well as the Epstein curettes to make sure there was no other ligamentous disk material, thoroughly irrigated out the area and loosely applied some Ultrafoam, Gelfoam material and closed the wound layers in usual fashion.  I left a small distal deep and proximal part of the wound open for drainage purposes.  At the beginning, I injected 20 mL of 0.5% Marcaine with epinephrine.  At the end, we injected 20 mL of Exparel.  The wound was closed with staples finally and then sterile dressing was applied.          ______________________________ Georges Lynch Darrelyn Hillock, M.D.     RAG/MEDQ  D:  01/01/2016   T:  01/01/2016  Job:  [id number]

## 2016-01-02 ENCOUNTER — Encounter (HOSPITAL_COMMUNITY): Payer: Self-pay | Admitting: Orthopedic Surgery

## 2016-01-02 DIAGNOSIS — M4807 Spinal stenosis, lumbosacral region: Secondary | ICD-10-CM | POA: Diagnosis not present

## 2016-01-02 NOTE — Progress Notes (Signed)
Occupational Therapy Evaluation Patient Details Name: Kimberly Shaw MRN: [id number] DOB: 05-21-1976 Today's Date: 01/02/2016    History of Present Illness Pt s/p L5-S1 Decompressive Laminectomy   Clinical Impression   All OT education completed and pt questions answered. No further OT needs at this time. Will sign off.    Follow Up Recommendations  No OT follow up;Supervision - Intermittent    Equipment Recommendations  None recommended by OT (pt declines 3 in 1)   Recommendations for Other Services       Precautions / Restrictions Precautions Precautions: Back Precaution Booklet Issued: Yes (comment) Precaution Comments: handout given and reviewed in detail Restrictions Weight Bearing Restrictions: No      Mobility Bed Mobility           General bed mobility comments: NT -- OOB with PT  Transfers Overall transfer level: Needs assistance Equipment used: Rolling walker (2 wheeled) Transfers: Sit to/from Stand Sit to Stand: Supervision             Balance                                            ADL Overall ADL's : Needs assistance/impaired Eating/Feeding: Independent;Sitting   Grooming: Supervision/safety;Standing   Upper Body Bathing: Set up;Sitting   Lower Body Bathing: Minimal assistance;Adhering to back precautions;Sit to/from stand   Upper Body Dressing : Set up;Sitting   Lower Body Dressing: Minimal assistance;Moderate assistance;Adhering to back precautions;Sit to/from stand   Toilet Transfer: Supervision/safety;Ambulation;Comfort height toilet;RW   Toileting- Clothing Manipulation and Hygiene: Minimal assistance;Sit to/from stand   Tub/ Engineer, structural: Education officer, environmental Details (indicate cue type and reason): verbal education of technique; pt declined to practice Functional mobility during ADLs: Supervision/safety;Rolling walker General ADL Comments: Husband to assist with LB ADLs. Patient  educated on availability of AE for LB self-care as well as toilet aide. Patient reports she will have husband assist as needed. Denies need for 3 in 1. All OT education completed.     Vision     Perception     Praxis      Pertinent Vitals/Pain Pain Assessment: 0-10 Pain Score: 2  Pain Location: back Pain Descriptors / Indicators: Discomfort;Tightness Pain Intervention(s): Limited activity within patient's tolerance;Premedicated before session;Repositioned     Hand Dominance     Extremity/Trunk Assessment Upper Extremity Assessment Upper Extremity Assessment: Overall WFL for tasks assessed   Lower Extremity Assessment Lower Extremity Assessment: Defer to PT evaluation   Cervical / Trunk Assessment Cervical / Trunk Assessment: Normal   Communication Communication Communication: No difficulties   Cognition Arousal/Alertness: Awake/alert Behavior During Therapy: WFL for tasks assessed/performed Overall Cognitive Status: Within Functional Limits for tasks assessed                     General Comments       Exercises Exercises: General Lower Extremity     Shoulder Instructions      Home Living Family/patient expects to be discharged to:: Private residence Living Arrangements: Spouse/significant other Available Help at Discharge: Family Type of Home: House Home Access: Stairs to enter Secretary/administrator of Steps: 4 Entrance Stairs-Rails: Right;Left;Can reach both Home Layout: One level     Bathroom Shower/Tub: Producer, television/film/video: Standard     Home Equipment: None          Prior Functioning/Environment Level of  Independence: Independent        Comments: Husband assisting with ADL as needed    OT Diagnosis: Acute pain   OT Problem List: Decreased knowledge of use of DME or AE;Decreased knowledge of precautions;Pain   OT Treatment/Interventions:      OT Goals(Current goals can be found in the care plan section) Acute  Rehab OT Goals Patient Stated Goal: Regain IND OT Goal Formulation: All assessment and education complete, DC therapy  OT Frequency:     Barriers to D/C:            Co-evaluation              End of Session Equipment Utilized During Treatment: Rolling walker Nurse Communication: Mobility status  Activity Tolerance: Patient tolerated treatment well Patient left: in chair;with call bell/phone within reach;with family/visitor present   Time: 3086-5784 OT Time Calculation (min): 16 min Charges:  OT General Charges $OT Visit: 1 Procedure OT Evaluation $OT Eval Low Complexity: 1 Procedure G-Codes: OT G-codes **NOT FOR INPATIENT CLASS** Functional Assessment Tool Used: clinical judgment Functional Limitation: Self care Self Care Current Status (O9629): At least 20 percent but less than 40 percent impaired, limited or restricted Self Care Goal Status (B2841): At least 1 percent but less than 20 percent impaired, limited or restricted Self Care Discharge Status [id number]): At least 20 percent but less than 40 percent impaired, limited or restricted  Bristyl Mclees A 01/02/2016, 9:20 AM

## 2016-01-02 NOTE — Care Management Note (Signed)
Case Management Note  Patient Details  Name: Dara Lordseggy Mendez MRN: [id number] Date of Birth: 01/11/1976  Subjective/Objective:                  CENTRAL DECOMPRESSION L5,S1 FOR STENOSIS, FORAMINOTOMY S1 NERVE ROOT ON LEFT, MICRODISCECTOMY L5,S1 (Left) Action/Plan: Discharge planning Expected Discharge Date:     01/02/16             Expected Discharge Plan:  Home/Self Care  In-House Referral:     Discharge planning Services  CM Consult  Post Acute Care Choice:  Durable Medical Equipment Choice offered to:     DME Arranged:  Walker rolling DME Agency:  [facility]  HH Arranged:  NA HH Agency:  NA  Status of Service:  Completed, signed off  If discussed at Long Length of Stay Meetings, dates discussed:    Additional Comments: RN notified CM pt will need rolling walker.  CM notified [facility] DME rep, Jermaine to please deliver the rolling walker to room so pt can discharge.  No other CM needs were communicated. Yves DillJeffries, Brailon Don Christine, RN 01/02/2016, 9:50 AM

## 2016-01-02 NOTE — Discharge Summary (Signed)
Physician Discharge Summary   Patient ID: Kimberly Shaw MRN: [id number] DOB/AGE: 07-15-1975 40 y.o.  Admit date: 01/01/2016 Discharge date: 01/02/2016  Primary Diagnosis: Lumbar spinal stenosis Lumbar disc herniation  Admission Diagnoses:  Past Medical History:  Diagnosis Date  . GERD (gastroesophageal reflux disease)   . Hypothyroidism   . Migraines   . Thyroid disease    Discharge Diagnoses:   Active Problems:   Spinal stenosis, lumbar region, with neurogenic claudication  Estimated body mass index is 25.63 kg/m as calculated from the following:   Height as of this encounter: _0  (1.651 m).   Weight as of this encounter: 69.9 kg (154 lb).  Procedure:  Procedure(s) (LRB): CENTRAL DECOMPRESSION L5,S1 FOR STENOSIS, FORAMINOTOMY S1 NERVE ROOT ON LEFT, MICRODISCECTOMY L5,S1 (Left)   Consults: None  HPI: The patient is a 40 year old female with the chief complaint of low back pain with radiation of pain into the left leg. She denies any injury. She has been having trouble with this for about a year. It has been worse in the past 4 months. MRI showed a disc herniation at L5-S1 on the left.   Laboratory Data: Hospital Outpatient Visit on 12/30/2015  Component Date Value Ref Range Status  . aPTT 12/30/2015 30  24 - 36 seconds Final  . WBC 12/30/2015 7.4  4.0 - 10.5 K/uL Final  . RBC 12/30/2015 4.45  3.87 - 5.11 MIL/uL Final  . Hemoglobin 12/30/2015 13.6  12.0 - 15.0 g/dL Final  . HCT 12/30/2015 40.3  36.0 - 46.0 % Final  . MCV 12/30/2015 90.6  78.0 - 100.0 fL Final  . MCH 12/30/2015 30.6  26.0 - 34.0 pg Final  . MCHC 12/30/2015 33.7  30.0 - 36.0 g/dL Final  . RDW 12/30/2015 12.6  11.5 - 15.5 % Final  . Platelets 12/30/2015 244  150 - 400 K/uL Final  . Neutrophils Relative % 12/30/2015 58  % Final  . Neutro Abs 12/30/2015 4.3  1.7 - 7.7 K/uL Final  . Lymphocytes Relative 12/30/2015 34  % Final  . Lymphs Abs 12/30/2015 2.5  0.7 - 4.0 K/uL Final  . Monocytes Relative  12/30/2015 5  % Final  . Monocytes Absolute 12/30/2015 0.4  0.1 - 1.0 K/uL Final  . Eosinophils Relative 12/30/2015 2  % Final  . Eosinophils Absolute 12/30/2015 0.1  0.0 - 0.7 K/uL Final  . Basophils Relative 12/30/2015 1  % Final  . Basophils Absolute 12/30/2015 0.0  0.0 - 0.1 K/uL Final  . Sodium 12/30/2015 136  135 - 145 mmol/L Final  . Potassium 12/30/2015 3.8  3.5 - 5.1 mmol/L Final  . Chloride 12/30/2015 104  101 - 111 mmol/L Final  . CO2 12/30/2015 25  22 - 32 mmol/L Final  . Glucose, Bld 12/30/2015 97  65 - 99 mg/dL Final  . BUN 12/30/2015 9  6 - 20 mg/dL Final  . Creatinine, Ser 12/30/2015 0.57  0.44 - 1.00 mg/dL Final  . Calcium 12/30/2015 8.8* 8.9 - 10.3 mg/dL Final  . Total Protein 12/30/2015 7.4  6.5 - 8.1 g/dL Final  . Albumin 12/30/2015 4.3  3.5 - 5.0 g/dL Final  . AST 12/30/2015 15  15 - 41 U/L Final  . ALT 12/30/2015 15  14 - 54 U/L Final  . Alkaline Phosphatase 12/30/2015 69  38 - 126 U/L Final  . Total Bilirubin 12/30/2015 0.6  0.3 - 1.2 mg/dL Final  . GFR calc non Af Amer 12/30/2015 >60  >60 mL/min Final  .  GFR calc Af Amer 12/30/2015 >60  >60 mL/min Final   Comment: (NOTE) The eGFR has been calculated using the CKD EPI equation. This calculation has not been validated in all clinical situations. eGFR's persistently <60 mL/min signify possible Chronic Kidney Disease.   . Anion gap 12/30/2015 7  5 - 15 Final  . Prothrombin Time 12/30/2015 13.2  11.4 - 15.2 seconds Final  . INR 12/30/2015 1.00   Final  . ABO/RH(D) 01/01/2016 B POS   Final  . Antibody Screen 01/01/2016 NEG   Final  . Sample Expiration 01/01/2016 01/04/2016   Final  . Extend sample reason 01/01/2016 NO TRANSFUSIONS OR PREGNANCY IN THE PAST 3 MONTHS   Final  . Color, Urine 12/30/2015 YELLOW  YELLOW Final  . APPearance 12/30/2015 CLOUDY* CLEAR Final  . Specific Gravity, Urine 12/30/2015 1.007  1.005 - 1.030 Final  . pH 12/30/2015 6.5  5.0 - 8.0 Final  . Glucose, UA 12/30/2015 NEGATIVE  NEGATIVE  mg/dL Final  . Hgb urine dipstick 12/30/2015 NEGATIVE  NEGATIVE Final  . Bilirubin Urine 12/30/2015 NEGATIVE  NEGATIVE Final  . Ketones, ur 12/30/2015 NEGATIVE  NEGATIVE mg/dL Final  . Protein, ur 12/30/2015 NEGATIVE  NEGATIVE mg/dL Final  . Nitrite 12/30/2015 NEGATIVE  NEGATIVE Final  . Leukocytes, UA 12/30/2015 NEGATIVE  NEGATIVE Final  . Preg Test, Ur 12/30/2015 NEGATIVE  NEGATIVE Final   Comment:        THE SENSITIVITY OF THIS METHODOLOGY IS >20 mIU/mL.   . MRSA, PCR 12/30/2015 NEGATIVE  NEGATIVE Final  . Staphylococcus aureus 12/30/2015 NEGATIVE  NEGATIVE Final   Comment:        The Xpert SA Assay (FDA approved for NASAL specimens in patients over 42 years of age), is one component of a comprehensive surveillance program.  Test performance has been validated by [facility] for patients greater than or equal to 3 year old. It is not intended to diagnose infection nor to guide or monitor treatment.   . ABO/RH(D) 12/30/2015 B POS   Final     X-Rays:Dg Chest 2 View  Result Date: 12/30/2015 CLINICAL DATA:  Preop chest radiograph (lumbar spine surgery) EXAM: CHEST  2 VIEW COMPARISON:  None. FINDINGS: Normal cardiac silhouette and mediastinal contours. No focal airspace opacities. No pleural effusion or pneumothorax. There is a minimal amount of pleural parenchymal thickening about the peripheral aspect the right minor fissure. No evidence of edema. No acute osseus abnormalities. IMPRESSION: No acute cardiopulmonary disease. Electronically Signed   By: Sandi Mariscal M.D.   On: 12/30/2015 09:34   Dg Lumbar Spine 2-3 Views  Result Date: 12/30/2015 CLINICAL DATA:  Preoperative evaluation for L4-L5 micro discectomy. EXAM: LUMBAR SPINE - 2-3 VIEW COMPARISON:  Lumbar spine radiographs - 08/30/2015 FINDINGS: There are 5 non rib-bearing lumbar type vertebral bodies. Normal alignment of the lumbar spine. No anterolisthesis or retrolisthesis. Mild (< 25%) compression deformities involving the  superior endplates of the L1 and L3 vertebral bodies are unchanged since 08/2015 examination. Remaining lumbar vertebral body heights appear preserved. Mild multilevel lumbar spine DDD, worse at T12-L1 and L2-L3 with disc space height loss, endplate irregularity and sclerosis. Limited visualization of the bilateral SI joints is normal. Large colonic stool burden. Regional soft tissues appear otherwise normal. IMPRESSION: 1. Mild multilevel lumbar spine DDD. 2. Unchanged mild (under 25%) compression deformities involving the superior endplates of the L1 and L3 vertebral bodies, similar to the 08/2015 examination. Electronically Signed   By: Sandi Mariscal M.D.   On: 12/30/2015  09:32   Dg Spine Portable 1 View  Result Date: 01/01/2016 CLINICAL DATA:  L5-S1 lumbar surgery. Intraoperative localization film. EXAM: PORTABLE SPINE - 1 VIEW COMPARISON:  Localization films earlier today. FINDINGS: Single lateral view of the lumbar spine is provided. Probe projects over the L5-S1 disc interspace. IMPRESSION: As above. Electronically Signed   By: Inge Rise M.D.   On: 01/01/2016 10:38   Dg Spine Portable 1 View  Result Date: 01/01/2016 CLINICAL DATA:  Lumbar disc herniation at L5-S1. EXAM: PORTABLE SPINE - 1 VIEW COMPARISON:  Film at 0910 hours FINDINGS: Cross-table lateral film at 0922 hours demonstrates positioning of retractors posteriorly with metallic instrument present between the spinous processes of L5 and S1 and directed towards the posterior aspect of the L5-S1 disc space. IMPRESSION: Posterior localization during lumbar surgery of the L5-S1 level. Electronically Signed   By: Aletta Edouard M.D.   On: 01/01/2016 10:02   Dg Spine Portable 1 View  Result Date: 01/01/2016 CLINICAL DATA:  Patient for L5-S1 lumbar surgery. Intraoperative localization film. EXAM: PORTABLE SPINE - 1 VIEW COMPARISON:  Localization film earlier today. FINDINGS: Two probes are identified. The more superior is at the level of the  L5 pedicles. The more inferior is at the level of the L5-S1 interspace. IMPRESSION: Localization as above. Electronically Signed   By: Inge Rise M.D.   On: 01/01/2016 09:41   Dg Spine Portable 1 View  Result Date: 01/01/2016 CLINICAL DATA:  Intraoperative localization film. Patient for L5-S1 lumbar surgery. EXAM: PORTABLE SPINE - 1 VIEW COMPARISON:  Plain films lumbar spine 12/30/2015. FINDINGS: 2 probes are in place. Tip of the more superior is directed just below the level of the L5-S1 interspace. Tip of the more inferior probe is directed at the level of S1-2. Mild superior endplate compression fracture of L3 is noted. IMPRESSION: Localization as above. Electronically Signed   By: Inge Rise M.D.   On: 01/01/2016 09:20    EKG: Orders placed or performed during the hospital encounter of 12/30/15  . EKG  . EKG     Hospital Course: Kimberly Shaw is a 40 y.o. who was admitted to [facility]. They were brought to the operating room on 01/01/2016 and underwent Procedure(s): CENTRAL DECOMPRESSION L5,S1 FOR STENOSIS, FORAMINOTOMY S1 NERVE ROOT ON LEFT, MICRODISCECTOMY L5,S1.  Patient tolerated the procedure well and was later transferred to the recovery room and then to the orthopaedic floor for postoperative care.  They were given PO and IV analgesics for pain control following their surgery.  They were given 24 hours of postoperative antibiotics of  Anti-infectives    Start     Dose/Rate Route Frequency Ordered Stop   01/01/16 1600  ceFAZolin (ANCEF) IVPB 1 g/50 mL premix     1 g 100 mL/hr over 30 Minutes Intravenous Every 8 hours 01/01/16 1216 01/02/16 0604   01/01/16 0910  polymyxin B 500,000 Units, bacitracin 50,000 Units in sodium chloride irrigation 0.9 % 500 mL irrigation  Status:  Discontinued       As needed 01/01/16 0910 01/01/16 1049   01/01/16 0612  ceFAZolin (ANCEF) IVPB 2g/100 mL premix     2 g 200 mL/hr over 30 Minutes Intravenous On call to O.R. 01/01/16 3086  01/01/16 0856     and started on DVT prophylaxis in the form of Aspirin.   PT was ordered to ambulate with the patient.  Discharge planning consulted to help with postop disposition and equipment needs.  Patient had a fair  night on the evening of surgery.  They started to get up OOB with therapy on day one. Patient was seen in rounds and was ready to go home.   Diet: Cardiac diet Activity:WBAT Follow-up:in 2 weeks Disposition - Home Discharged Condition: stable   Discharge Instructions    Call MD / Call 911    Complete by:  As directed   If you experience chest pain or shortness of breath, CALL 911 and be transported to the hospital emergency room.  If you develope a fever above 101 F, pus (white drainage) or increased drainage or redness at the wound, or calf pain, call your surgeon's office.   Constipation Prevention    Complete by:  As directed   Drink plenty of fluids.  Prune juice may be helpful.  You may use a stool softener, such as Colace (over the counter) 100 mg twice a day.  Use MiraLax (over the counter) for constipation as needed.   Diet - low sodium heart healthy    Complete by:  As directed   Discharge instructions    Complete by:  As directed   For the first few days, remove your dressing, tape a piece of saran wrap over your incision. Take your shower, then remove the saran wrap and put a clean dressing on. After three days you can shower without the saran wrap.  Call Dr. Gladstone Lighter if any wound complications or temperature of 101 degrees F or over.  Call the office for an appointment to see Dr. Gladstone Lighter in two weeks: [phone] and ask for Dr. Charlestine Night nurse, Brunilda Payor.   Driving restrictions    Complete by:  As directed   No driving while taking pain medications   Increase activity slowly as tolerated    Complete by:  As directed   Lifting restrictions    Complete by:  As directed   No lifting       Medication List    TAKE these medications   levothyroxine 75  MCG tablet Commonly known as:  SYNTHROID, LEVOTHROID Take 75 mcg by mouth daily before breakfast.   methocarbamol 500 MG tablet Commonly known as:  ROBAXIN Take 1 tablet (500 mg total) by mouth every 6 (six) hours as needed for muscle spasms.   oxyCODONE-acetaminophen 5-325 MG tablet Commonly known as:  PERCOCET/ROXICET Take 1-2 tablets by mouth every 6 (six) hours as needed for moderate pain.      Follow-up Information    GIOFFRE,RONALD A, MD Follow up in 2 week(s).   Specialty:  Orthopedic Surgery Contact information: [address] [place] [address] [phone]        Inc[facility] - [facility] .   Why:  rolling walker Contact information: [address] [address] [phone]           Signed: Ardeen Jourdain, PA-C Orthopaedic Surgery 01/02/2016, 12:59 PM

## 2016-01-02 NOTE — Evaluation (Signed)
Physical Therapy Evaluation Patient Details Name: Kimberly Shaw MRN: [id number] DOB: [birth year] Today's Date: 01/02/2016   History of Present Illness  Pt s/p L5-S1 Decompressive Laminectomy  Clinical Impression  Pt s/p back surgery and presents with functional mobility limitations 2* post op pain and back precautions.  Pt educated on back precautions and mobilizing at Sup level with RW.  Pt plans dc home this date with assist of family.    Follow Up Recommendations No PT follow up    Equipment Recommendations  Rolling walker with 5" wheels    Recommendations for Other Services OT consult     Precautions / Restrictions Precautions Precautions: Back Restrictions Weight Bearing Restrictions: No      Mobility  Bed Mobility Overal bed mobility: Needs Assistance Bed Mobility: Rolling;Supine to Sit Rolling: Min guard;Supervision   Supine to sit: Min guard;Supervision     General bed mobility comments: cues for correct log roll technique  Transfers Overall transfer level: Needs assistance   Transfers: Sit to/from Stand Sit to Stand: Supervision         General transfer comment: cues for transition position, use of UEs to self assist and adherence to back precautions  Ambulation/Gait Ambulation/Gait assistance: Min guard;Supervision;Modified independent (Device/Increase time) Ambulation Distance (Feet): 450 Feet Assistive device: Rolling walker (2 wheeled);None Gait Pattern/deviations: Step-through pattern;Decreased step length - right;Decreased step length - left;Shuffle Gait velocity: decr Gait velocity interpretation: Below normal speed for age/gender General Gait Details: 100' sans assist device and additional 350' with RW.  Pt reports decreased back pain and increased feeling of stability with RW.  Cues for position from RW  Stairs Stairs: Yes Stairs assistance: Min guard Stair Management: Two rails;Step to pattern;Alternating pattern;Forwards Number of Stairs:  3    Wheelchair Mobility    Modified Rankin (Stroke Patients Only)       Balance                                             Pertinent Vitals/Pain Pain Assessment: 0-10 Pain Score: 2  Pain Location: back Pain Descriptors / Indicators: Sore;Tightness Pain Intervention(s): Limited activity within patient's tolerance;Monitored during session;Premedicated before session    Home Living Family/patient expects to be discharged to:: Private residence Living Arrangements: Spouse/significant other Available Help at Discharge: Family Type of Home: House Home Access: Stairs to enter Entrance Stairs-Rails: Right;Left;Can reach both Secretary/administrator of Steps: 4 Home Layout: One level Home Equipment: None      Prior Function Level of Independence: Independent         Comments: Husband assisting with ADL as needed     Hand Dominance        Extremity/Trunk Assessment   Upper Extremity Assessment: Overall WFL for tasks assessed           Lower Extremity Assessment: Overall WFL for tasks assessed      Cervical / Trunk Assessment: Normal  Communication   Communication: No difficulties  Cognition Arousal/Alertness: Awake/alert Behavior During Therapy: WFL for tasks assessed/performed Overall Cognitive Status: Within Functional Limits for tasks assessed                      General Comments      Exercises General Exercises - Lower Extremity Ankle Circles/Pumps: AROM;Both;15 reps;Supine      Assessment/Plan    PT Assessment Patient needs continued PT services  PT Diagnosis  Difficulty walking   PT Problem List Decreased strength;Decreased range of motion;Decreased activity tolerance;Decreased mobility;Decreased knowledge of use of DME;Pain;Decreased knowledge of precautions  PT Treatment Interventions DME instruction;Gait training;Stair training;Functional mobility training;Therapeutic activities;Patient/family education   PT  Goals (Current goals can be found in the Care Plan section) Acute Rehab PT Goals Patient Stated Goal: Regain IND PT Goal Formulation: All assessment and education complete, DC therapy    Frequency Min 1X/week   Barriers to discharge        Co-evaluation               End of Session   Activity Tolerance: Patient tolerated treatment well Patient left: Other (comment) (standing with OT) Nurse Communication: Mobility status    Functional Assessment Tool Used: Clinical judgement Functional Limitation: Mobility: Walking and moving around Mobility: Walking and Moving Around Current Status (Z6109): At least 1 percent but less than 20 percent impaired, limited or restricted Mobility: Walking and Moving Around Goal Status [phone]): At least 1 percent but less than 20 percent impaired, limited or restricted Mobility: Walking and Moving Around Discharge Status [phone]): At least 1 percent but less than 20 percent impaired, limited or restricted    Time: 0807-0829 PT Time Calculation (min) (ACUTE ONLY): 22 min   Charges:   PT Evaluation $PT Eval Low Complexity: 1 Procedure     PT G Codes:   PT G-Codes **NOT FOR INPATIENT CLASS** Functional Assessment Tool Used: Clinical judgement Functional Limitation: Mobility: Walking and moving around Mobility: Walking and Moving Around Current Status (B1478): At least 1 percent but less than 20 percent impaired, limited or restricted Mobility: Walking and Moving Around Goal Status [phone]): At least 1 percent but less than 20 percent impaired, limited or restricted Mobility: Walking and Moving Around Discharge Status [phone]): At least 1 percent but less than 20 percent impaired, limited or restricted    [facility] 01/02/2016, 8:49 AM

## 2016-03-09 ENCOUNTER — Ambulatory Visit (INDEPENDENT_AMBULATORY_CARE_PROVIDER_SITE_OTHER): Payer: BLUE CROSS/BLUE SHIELD

## 2016-03-09 ENCOUNTER — Ambulatory Visit (INDEPENDENT_AMBULATORY_CARE_PROVIDER_SITE_OTHER): Payer: BLUE CROSS/BLUE SHIELD | Admitting: Family Medicine

## 2016-03-09 ENCOUNTER — Encounter: Payer: Self-pay | Admitting: Family Medicine

## 2016-03-09 VITALS — BP 107/72 | HR 67 | Temp 97.5°F | Ht 65.0 in | Wt 161.2 lb

## 2016-03-09 DIAGNOSIS — M79645 Pain in left finger(s): Secondary | ICD-10-CM

## 2016-03-09 DIAGNOSIS — S60032A Contusion of left middle finger without damage to nail, initial encounter: Secondary | ICD-10-CM

## 2016-03-09 DIAGNOSIS — S62653A Nondisplaced fracture of medial phalanx of left middle finger, initial encounter for closed fracture: Secondary | ICD-10-CM

## 2016-03-09 NOTE — Progress Notes (Signed)
Addendum: X-ray was read by radiologist as a fracture, did orthopedic referral and had patient come back for splinting.

## 2016-03-09 NOTE — Progress Notes (Signed)
BP 107/72   Pulse 67   Temp 97.5 F (36.4 C) (Oral)   Ht 5\' 5"  (1.651 m)   Wt 161 lb 4 oz (73.1 kg)   BMI 26.83 kg/m    Subjective:    Patient ID: Kimberly Shaw, female    DOB: 12/07/1975, 40 y.o.   MRN: [id number]  HPI: Kimberly Shaw is a 40 y.o. female presenting on 03/09/2016 for Pain and swelling in 3rd digit of left hand (hit hand on cupholder/console of sofa)   HPI Pain and swelling in finger Patient hit her hand as she was reaching for something on her cup holder/console of her sofa and since then she's been having swelling and pain. This happened 2 days ago but yesterday is when a lot of the swelling and pain came up. She denies any numbness or weakness but does have significant wheezing limited range of motion because of the swelling. She is able to move both her PIP and DIP joint. The more significant pain is on the PIP joint. She also has bruising over that joint as well.  Relevant past medical, surgical, family and social history reviewed and updated as indicated. Interim medical history since our last visit reviewed. Allergies and medications reviewed and updated.  Review of Systems  Constitutional: Negative for chills and fever.  Respiratory: Negative for chest tightness and shortness of breath.   Cardiovascular: Negative for chest pain and leg swelling.  Musculoskeletal: Positive for arthralgias and joint swelling. Negative for back pain and gait problem.  Skin: Positive for color change (Bruising). Negative for rash.  Neurological: Negative for light-headedness and headaches.  Psychiatric/Behavioral: Negative for agitation and behavioral problems.  All other systems reviewed and are negative.   Per HPI unless specifically indicated above     Medication List       Accurate as of 03/09/16 11:44 AM. Always use your most recent med list.          levothyroxine 75 MCG tablet Commonly known as:  SYNTHROID, LEVOTHROID Take 75 mcg by mouth daily before  breakfast.          Objective:    BP 107/72   Pulse 67   Temp 97.5 F (36.4 C) (Oral)   Ht 5\' 5"  (1.651 m)   Wt 161 lb 4 oz (73.1 kg)   BMI 26.83 kg/m   Wt Readings from Last 3 Encounters:  03/09/16 161 lb 4 oz (73.1 kg)  01/01/16 154 lb (69.9 kg)  12/30/15 154 lb (69.9 kg)    Physical Exam  Constitutional: She is oriented to person, place, and time. She appears well-developed and well-nourished. No distress.  Eyes: Conjunctivae are normal.  Musculoskeletal: She exhibits no edema.       Left hand: She exhibits decreased range of motion, tenderness, bony tenderness and swelling. She exhibits normal two-point discrimination, normal capillary refill, no deformity and no laceration.       Hands: Neurological: She is alert and oriented to person, place, and time. Coordination normal.  Skin: Skin is warm and dry. No rash noted. She is not diaphoretic.  Psychiatric: She has a normal mood and affect. Her behavior is normal.  Nursing note and vitals reviewed.     Assessment & Plan:   Problem List Items Addressed This Visit    None    Visit Diagnoses    Contusion of left middle finger without damage to nail, initial encounter    -  Primary   Appears to be a deep  contusion or sprain of that left middle finger. Use ice and NSAIDs   Relevant Orders   DG Hand Complete Left (Completed)        X-ray came back read as a fracture by radiologist. Will have patient come back for splinting and will send to orthopedics  Follow up plan: Return if symptoms worsen or fail to improve.  Counseling provided for all of the vaccine components Orders Placed This Encounter  Procedures  . DG Hand Complete Left    Arville Care, MD [facility] 03/09/2016, 11:44 AM

## 2016-03-16 ENCOUNTER — Other Ambulatory Visit: Payer: Self-pay

## 2016-03-16 ENCOUNTER — Telehealth: Payer: Self-pay | Admitting: Family Medicine

## 2016-03-16 DIAGNOSIS — S62653A Nondisplaced fracture of medial phalanx of left middle finger, initial encounter for closed fracture: Secondary | ICD-10-CM

## 2016-03-17 ENCOUNTER — Telehealth: Payer: Self-pay | Admitting: Family

## 2016-04-06 ENCOUNTER — Telehealth: Payer: BLUE CROSS/BLUE SHIELD | Admitting: Family

## 2016-04-06 DIAGNOSIS — B9689 Other specified bacterial agents as the cause of diseases classified elsewhere: Secondary | ICD-10-CM

## 2016-04-06 DIAGNOSIS — J028 Acute pharyngitis due to other specified organisms: Secondary | ICD-10-CM

## 2016-04-06 MED ORDER — AZITHROMYCIN 250 MG PO TABS: ORAL_TABLET | ORAL | 0 refills | Status: DC

## 2016-04-06 MED ORDER — BENZONATATE 100 MG PO CAPS: 100.0000 mg | ORAL_CAPSULE | Freq: Three times a day (TID) | ORAL | 0 refills | Status: DC | PRN

## 2016-04-06 NOTE — Progress Notes (Signed)
We are sorry that you are not feeling well.  Here is how we plan to help!  Based on what you have shared with me it looks like you have upper respiratory tract inflammation that has resulted in a significant cough.  Inflammation and infection in the upper respiratory tract is commonly called bronchitis and has four common causes:  Allergies, Viral Infections, Acid Reflux and Bacterial Infections.  Allergies, viruses and acid reflux are treated by controlling symptoms or eliminating the cause. An example might be a cough caused by taking certain blood pressure medications. You stop the cough by changing the medication. Another example might be a cough caused by acid reflux. Controlling the reflux helps control the cough.  Based on your presentation I believe you most likely have A cough due to bacteria.  When patients have a fever and a productive cough with a change in color or increased sputum production, we are concerned about bacterial bronchitis.  If left untreated it can progress to pneumonia.  If your symptoms do not improve with your treatment plan it is important that you contact your provider.   I have prescribed Azithromyin 250 mg: two tables now and then one tablet daily for 4 additonal days    In addition you may use A non-prescription cough medication called Mucinex DM: take 2 tablets every 12 hours. and A prescription cough medication called Tessalon Perles 100mg . You may take 1-2 capsules every 8 hours as needed for your cough.  There is no other prescription cough med that would be given through the E-visit program. In an office visit, there are some with hydrocodone, but the E-visit program cannot send that particular one (narcotics).   USE OF BRONCHODILATOR ("RESCUE") INHALERS: There is a risk from using your bronchodilator too frequently.  The risk is that over-reliance on a medication which only relaxes the muscles surrounding the breathing tubes can reduce the effectiveness of  medications prescribed to reduce swelling and congestion of the tubes themselves.  Although you feel brief relief from the bronchodilator inhaler, your asthma may actually be worsening with the tubes becoming more swollen and filled with mucus.  This can delay other crucial treatments, such as oral steroid medications. If you need to use a bronchodilator inhaler daily, several times per day, you should discuss this with your provider.  There are probably better treatments that could be used to keep your asthma under control.     HOME CARE . Only take medications as instructed by your medical team. . Complete the entire course of an antibiotic. . Drink plenty of fluids and get plenty of rest. . Avoid close contacts especially the very young and the elderly . Cover your mouth if you cough or cough into your sleeve. . Always remember to wash your hands . A steam or ultrasonic humidifier can help congestion.   GET HELP RIGHT AWAY IF: . You develop worsening fever. . You become short of breath . You cough up blood. . Your symptoms persist after you have completed your treatment plan MAKE SURE YOU   Understand these instructions.  Will watch your condition.  Will get help right away if you are not doing well or get worse.  Your e-visit answers were reviewed by a board certified advanced clinical practitioner to complete your personal care plan.  Depending on the condition, your plan could have included both over the counter or prescription medications. If there is a problem please reply  once you have received a response  from your provider. Your safety is important to us.  If you have drug allergies check your prescription carefully.    You can use MyChart to ask questions about today's visit, request a non-urgent call back, or ask for a work or school excuse for 24 hours related to this e-Visit. If it has been greater than 24 hours you will need to follow up with your provider, or enter a new  e-Visit to address those concerns. You will get an e-mail in the next two days asking about your experience.  I hope that your e-visit has been valuable and will speed your recovery. Thank you for using e-visits.

## 2016-04-07 ENCOUNTER — Ambulatory Visit (INDEPENDENT_AMBULATORY_CARE_PROVIDER_SITE_OTHER): Payer: BLUE CROSS/BLUE SHIELD | Admitting: Family

## 2016-04-07 ENCOUNTER — Encounter: Payer: Self-pay | Admitting: Family

## 2016-04-07 VITALS — BP 113/78 | HR 88 | Temp 98.1°F | Ht 65.0 in | Wt 162.2 lb

## 2016-04-07 DIAGNOSIS — J069 Acute upper respiratory infection, unspecified: Secondary | ICD-10-CM

## 2016-04-07 MED ORDER — HYDROCODONE-HOMATROPINE 5-1.5 MG/5ML PO SYRP: 5.0000 mL | ORAL_SOLUTION | Freq: Three times a day (TID) | ORAL | 0 refills | Status: DC | PRN

## 2016-04-07 NOTE — Progress Notes (Signed)
Subjective:    Patient ID: Kimberly Shaw, female    DOB: 06/14/1975, 40 y.o.   MRN: [id number]  Pt did an evisit yesterday and was given Zpak and tessalon. Pt states she wants an rx of cough syrup.  Cough  This is a new problem. The current episode started 1 to 4 weeks ago. The problem has been gradually worsening. The problem occurs every few minutes. The cough is productive of purulent sputum. Associated symptoms include a fever, headaches, nasal congestion, postnasal drip, rhinorrhea and a sore throat. Pertinent negatives include no chills, ear congestion, ear pain, shortness of breath or wheezing. The symptoms are aggravated by lying down. Risk factors for lung disease include smoking/tobacco exposure. She has tried rest and OTC cough suppressant (zpak day 2 today) for the symptoms. The treatment provided mild relief.      Review of Systems  Constitutional: Positive for fever. Negative for chills.  HENT: Positive for postnasal drip, rhinorrhea and sore throat. Negative for ear pain.   Respiratory: Positive for cough. Negative for shortness of breath and wheezing.   Neurological: Positive for headaches.  All other systems reviewed and are negative.      Objective:   Physical Exam  Constitutional: She is oriented to person, place, and time. She appears well-developed and well-nourished. No distress.  HENT:  Head: Normocephalic and atraumatic.  Right Ear: External ear normal.  Left Ear: External ear normal.  Nose: Mucosal edema and rhinorrhea present.  Mouth/Throat: Posterior oropharyngeal erythema present.  Eyes: Pupils are equal, round, and reactive to light.  Neck: Normal range of motion. Neck supple. No thyromegaly present.  Cardiovascular: Normal rate, regular rhythm, normal heart sounds and intact distal pulses.   No murmur heard. Pulmonary/Chest: Effort normal and breath sounds normal. No respiratory distress. She has no wheezes.  Constant nonproductive cough   Abdominal:  Soft. Bowel sounds are normal. She exhibits no distension. There is no tenderness.  Musculoskeletal: Normal range of motion. She exhibits no edema or tenderness.  Neurological: She is alert and oriented to person, place, and time. She has normal reflexes. No cranial nerve deficit.  Skin: Skin is warm and dry.  Psychiatric: She has a normal mood and affect. Her behavior is normal. Judgment and thought content normal.  Vitals reviewed.     BP 113/78   Pulse 88   Temp 98.1 F (36.7 C) (Oral)   Ht 5\' 5"  (1.651 m)   Wt 162 lb 3.2 oz (73.6 kg)   BMI 26.99 kg/m      Assessment & Plan:  1. Upper respiratory infection with cough and congestion Continue Zpak -- Take meds as prescribed - Use a cool mist humidifier  -Use saline nose sprays frequently -Saline irrigations of the nose can be very helpful if done frequently.  * 4X daily for 1 week*  * Use of a nettie pot can be helpful with this. Follow directions with this* -Force fluids -For any cough or congestion  Use plain Mucinex- regular strength or max strength is fine   * Children- consult with Pharmacist for dosing -For fever or aces or pains- take tylenol or ibuprofen appropriate for age and weight.  * for fevers greater than 101 orally you may alternate ibuprofen and tylenol every  3 hours. -Throat lozenges if help -New toothbrush in 3 days - HYDROcodone-homatropine (HYCODAN) 5-1.5 MG/5ML syrup; Take 5 mLs by mouth every 8 (eight) hours as needed for cough.  Dispense: 120 mL; Refill: 0  Jannifer Rodneyhristy Fenix Ruppe, FNP

## 2016-04-07 NOTE — Patient Instructions (Signed)
Upper Respiratory Infection, Adult Most upper respiratory infections (URIs) are a viral infection of the air passages leading to the lungs. A URI affects the nose, throat, and upper air passages. The most common type of URI is nasopharyngitis and is typically referred to as "the common cold." URIs run their course and usually go away on their own. Most of the time, a URI does not require medical attention, but sometimes a bacterial infection in the upper airways can follow a viral infection. This is called a secondary infection. Sinus and middle ear infections are common types of secondary upper respiratory infections. Bacterial pneumonia can also complicate a URI. A URI can worsen asthma and chronic obstructive pulmonary disease (COPD). Sometimes, these complications can require emergency medical care and may be life threatening. What are the causes? Almost all URIs are caused by viruses. A virus is a type of germ and can spread from one person to another. What increases the risk? You may be at risk for a URI if:  You smoke.  You have chronic heart or lung disease.  You have a weakened defense (immune) system.  You are very young or very old.  You have nasal allergies or asthma.  You work in crowded or poorly ventilated areas.  You work in health care facilities or schools.  What are the signs or symptoms? Symptoms typically develop 2-3 days after you come in contact with a cold virus. Most viral URIs last 7-10 days. However, viral URIs from the influenza virus (flu virus) can last 14-18 days and are typically more severe. Symptoms may include:  Runny or stuffy (congested) nose.  Sneezing.  Cough.  Sore throat.  Headache.  Fatigue.  Fever.  Loss of appetite.  Pain in your forehead, behind your eyes, and over your cheekbones (sinus pain).  Muscle aches.  How is this diagnosed? Your health care provider may diagnose a URI by:  Physical exam.  Tests to check that your  symptoms are not due to another condition such as: ? Strep throat. ? Sinusitis. ? Pneumonia. ? Asthma.  How is this treated? A URI goes away on its own with time. It cannot be cured with medicines, but medicines may be prescribed or recommended to relieve symptoms. Medicines may help:  Reduce your fever.  Reduce your cough.  Relieve nasal congestion.  Follow these instructions at home:  Take medicines only as directed by your health care provider.  Gargle warm saltwater or take cough drops to comfort your throat as directed by your health care provider.  Use a warm mist humidifier or inhale steam from a shower to increase air moisture. This may make it easier to breathe.  Drink enough fluid to keep your urine clear or pale yellow.  Eat soups and other clear broths and maintain good nutrition.  Rest as needed.  Return to work when your temperature has returned to normal or as your health care provider advises. You may need to stay home longer to avoid infecting others. You can also use a face mask and careful hand washing to prevent spread of the virus.  Increase the usage of your inhaler if you have asthma.  Do not use any tobacco products, including cigarettes, chewing tobacco, or electronic cigarettes. If you need help quitting, ask your health care provider. How is this prevented? The best way to protect yourself from getting a cold is to practice good hygiene.  Avoid oral or hand contact with people with cold symptoms.  Wash your   hands often if contact occurs.  There is no clear evidence that vitamin C, vitamin E, echinacea, or exercise reduces the chance of developing a cold. However, it is always recommended to get plenty of rest, exercise, and practice good nutrition. Contact a health care provider if:  You are getting worse rather than better.  Your symptoms are not controlled by medicine.  You have chills.  You have worsening shortness of breath.  You have  brown or red mucus.  You have yellow or brown nasal discharge.  You have pain in your face, especially when you bend forward.  You have a fever.  You have swollen neck glands.  You have pain while swallowing.  You have white areas in the back of your throat. Get help right away if:  You have severe or persistent: ? Headache. ? Ear pain. ? Sinus pain. ? Chest pain.  You have chronic lung disease and any of the following: ? Wheezing. ? Prolonged cough. ? Coughing up blood. ? A change in your usual mucus.  You have a stiff neck.  You have changes in your: ? Vision. ? Hearing. ? Thinking. ? Mood. This information is not intended to replace advice given to you by your health care provider. Make sure you discuss any questions you have with your health care provider. Document Released: 11/04/2000 Document Revised: 01/12/2016 Document Reviewed: 08/16/2013 Elsevier Interactive Patient Education  2017 [employer]  

## 2017-05-06 DIAGNOSIS — Z1231 Encounter for screening mammogram for malignant neoplasm of breast: Secondary | ICD-10-CM | POA: Diagnosis not present

## 2017-05-19 ENCOUNTER — Encounter: Payer: Self-pay | Admitting: Family

## 2017-05-19 DIAGNOSIS — N6310 Unspecified lump in the right breast, unspecified quadrant: Secondary | ICD-10-CM | POA: Diagnosis not present

## 2017-05-19 DIAGNOSIS — R922 Inconclusive mammogram: Secondary | ICD-10-CM | POA: Diagnosis not present

## 2017-05-19 DIAGNOSIS — N6001 Solitary cyst of right breast: Secondary | ICD-10-CM | POA: Diagnosis not present

## 2017-08-09 DIAGNOSIS — Z6826 Body mass index (BMI) 26.0-26.9, adult: Secondary | ICD-10-CM | POA: Diagnosis not present

## 2017-08-09 DIAGNOSIS — Z01419 Encounter for gynecological examination (general) (routine) without abnormal findings: Secondary | ICD-10-CM | POA: Diagnosis not present

## 2017-08-09 DIAGNOSIS — E039 Hypothyroidism, unspecified: Secondary | ICD-10-CM | POA: Diagnosis not present

## 2017-09-11 IMAGING — DX DG LUMBAR SPINE 2-3V
3 series · 3 of 3 positions shown · non-contrast
Comparison: Lumbar spine radiographs - 08/30/2015

CLINICAL DATA: Preoperative evaluation for L4-L5 micro discectomy.

EXAM:
LUMBAR SPINE - 2-3 VIEW

[l-spine ap]
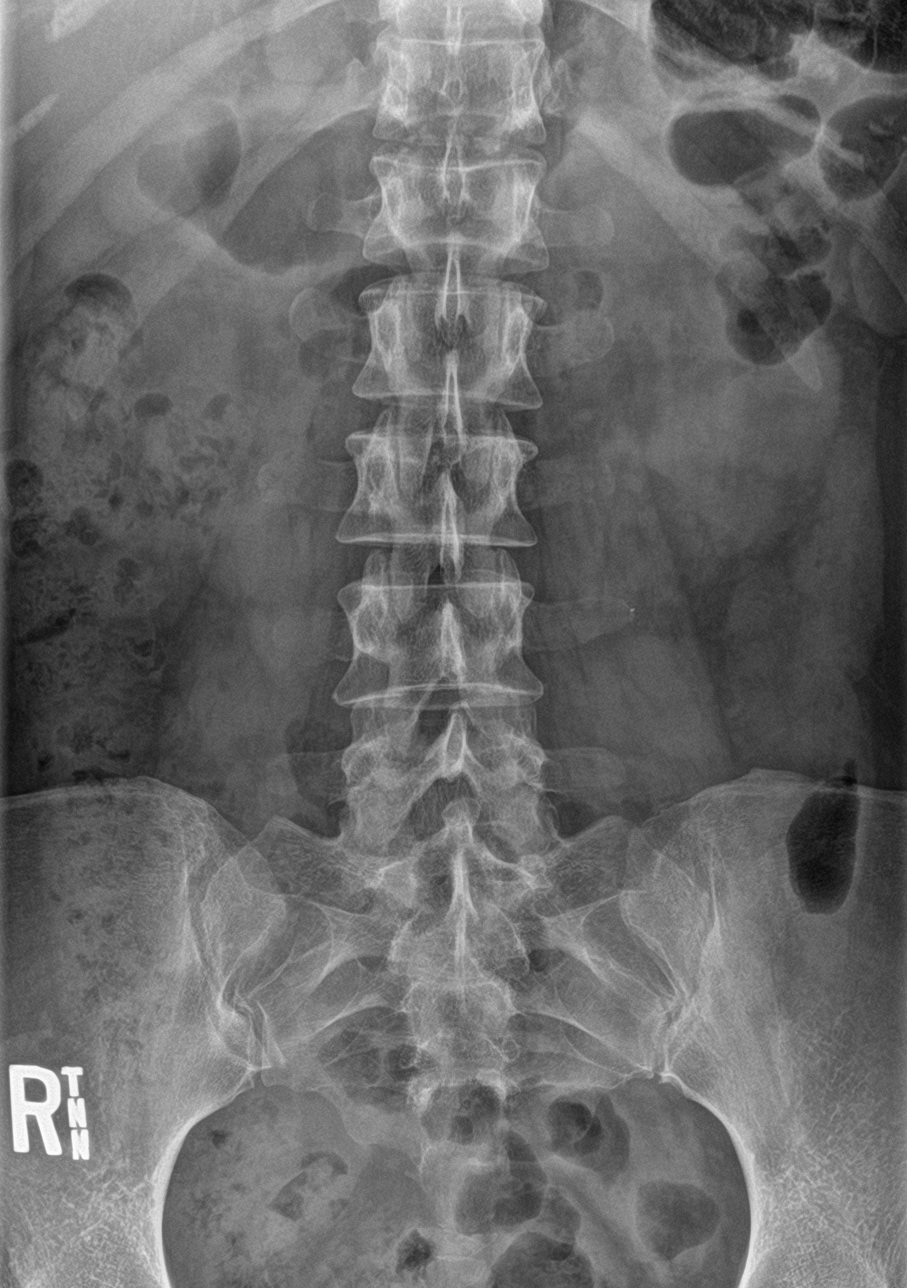

[l-spine lat]
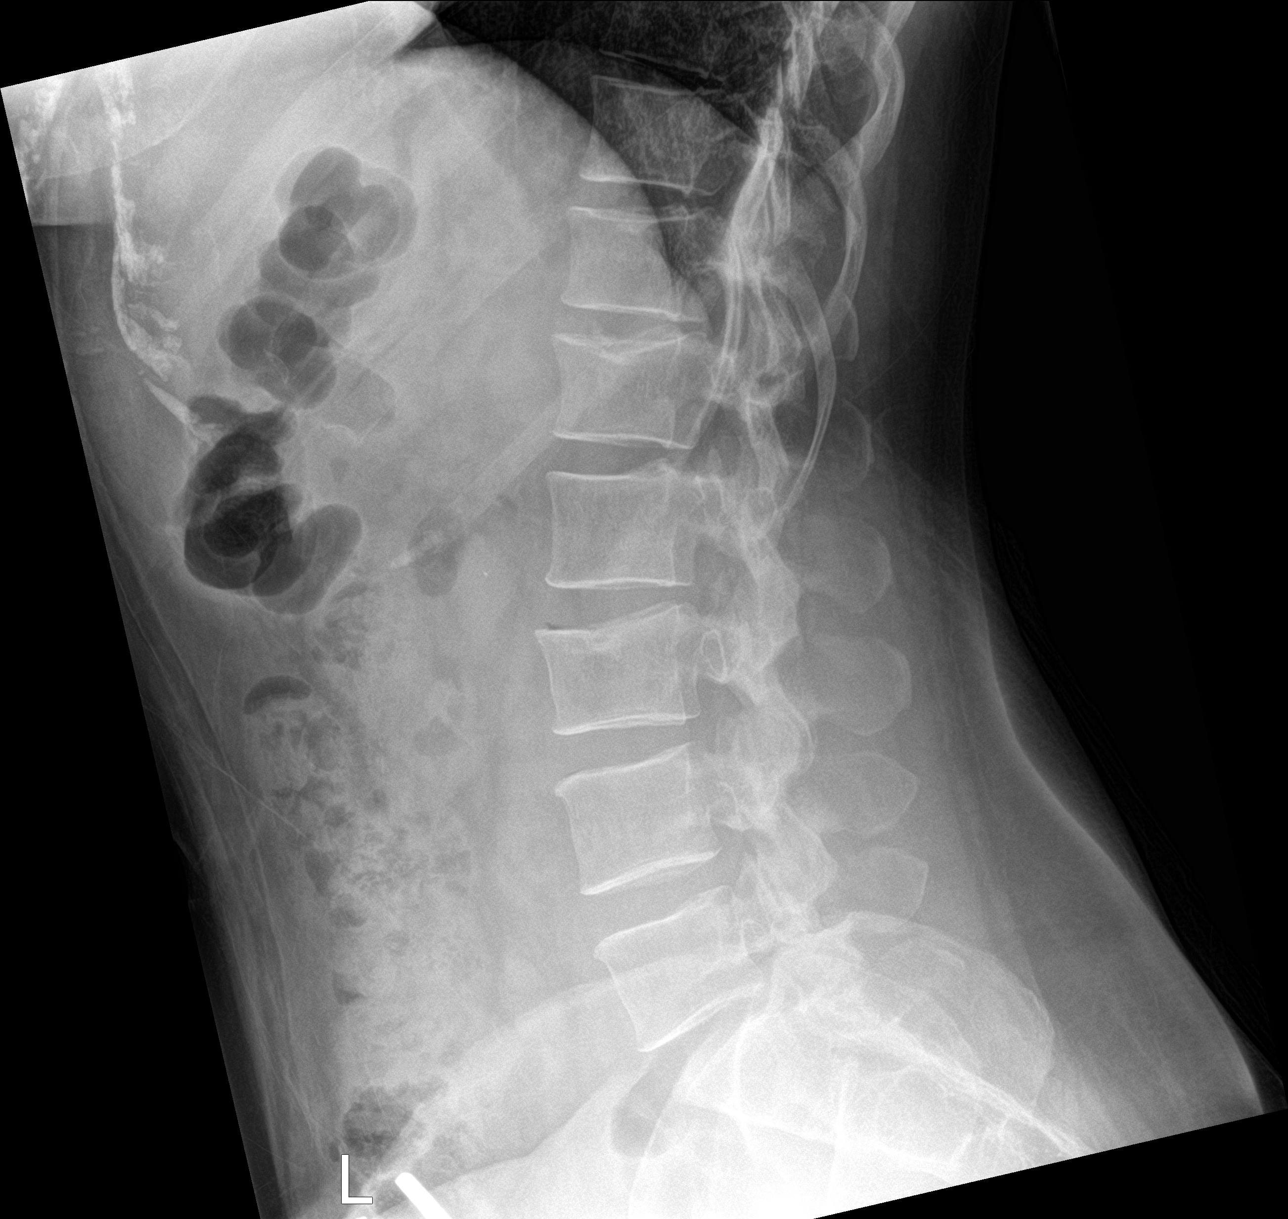

[l-spine spot]
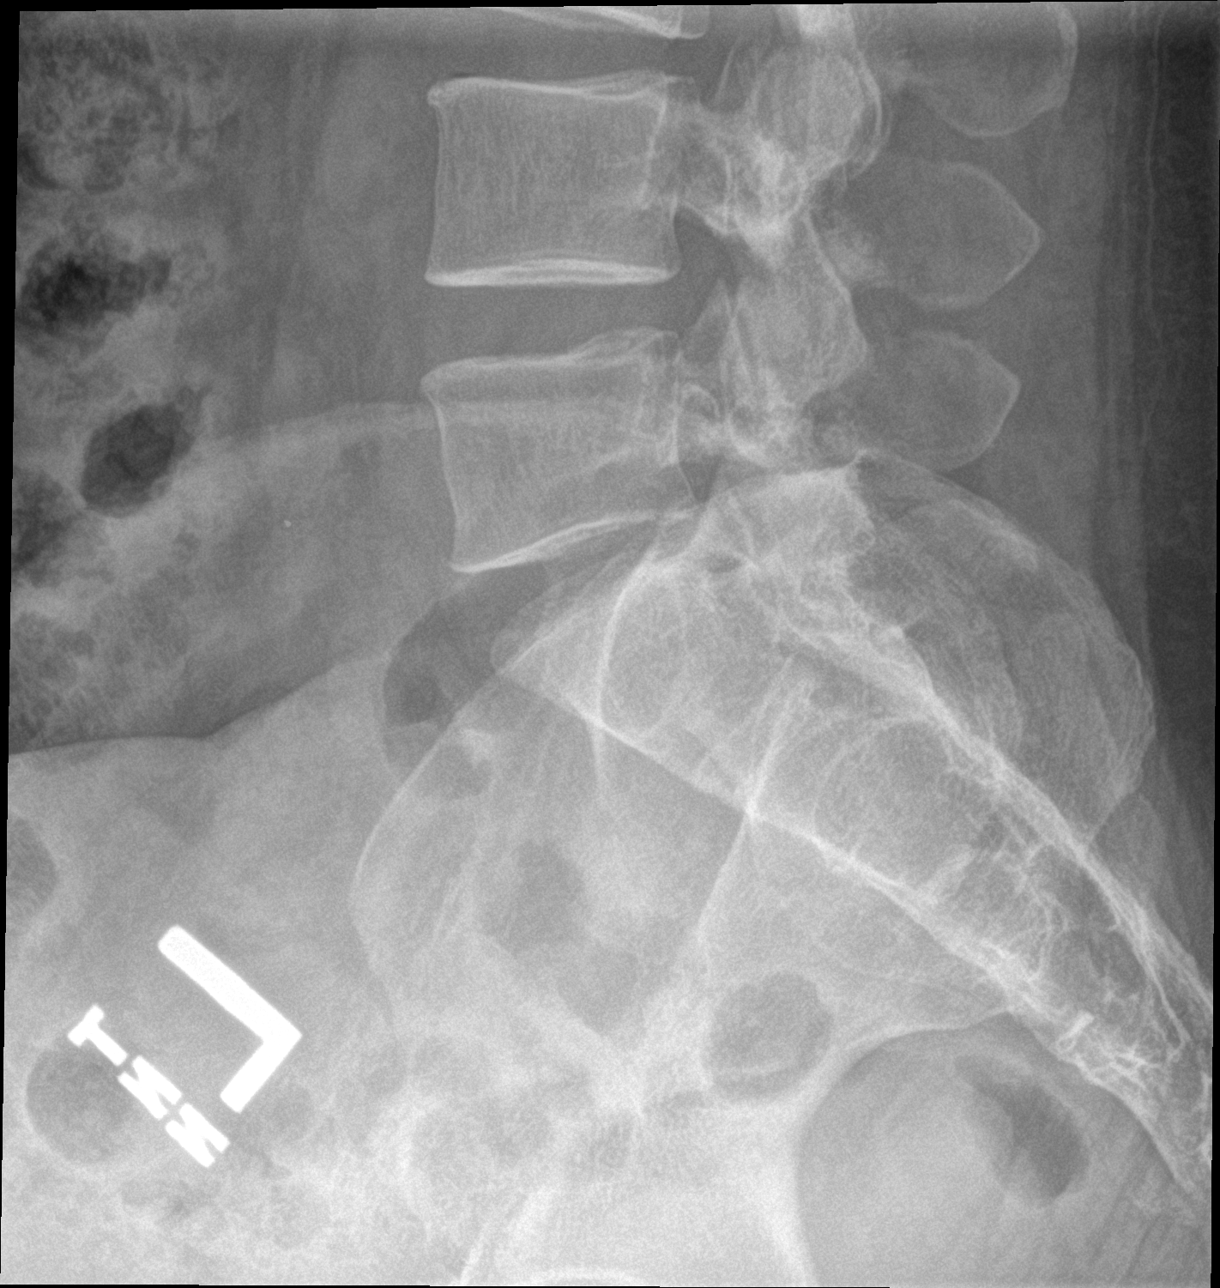

[3 of 3 positions shown; findings below may reference images not displayed]

FINDINGS: There are 5 non rib-bearing lumbar type vertebral bodies.

Normal alignment of the lumbar spine. No anterolisthesis or
retrolisthesis.

Mild (< 25%) compression deformities involving the superior
endplates of the L1 and L3 vertebral bodies are unchanged since
[DATE] examination. Remaining lumbar vertebral body heights appear
preserved.

Mild multilevel lumbar spine DDD, worse at T12-L1 and L2-L3 with
disc space height loss, endplate irregularity and sclerosis.

Limited visualization of the bilateral SI joints is normal.

Large colonic stool burden. Regional soft tissues appear otherwise
normal.
IMPRESSION: 1. Mild multilevel lumbar spine DDD.
2. Unchanged mild (under 25%) compression deformities involving the
superior endplates of the L1 and L3 vertebral bodies, similar to the
[DATE] examination.

## 2017-09-11 IMAGING — DX DG CHEST 2V
2 series · 2 of 2 positions shown · non-contrast
Comparison: None.

CLINICAL DATA: Preop chest radiograph (lumbar spine surgery)

EXAM:
CHEST  2 VIEW

[chest pa]
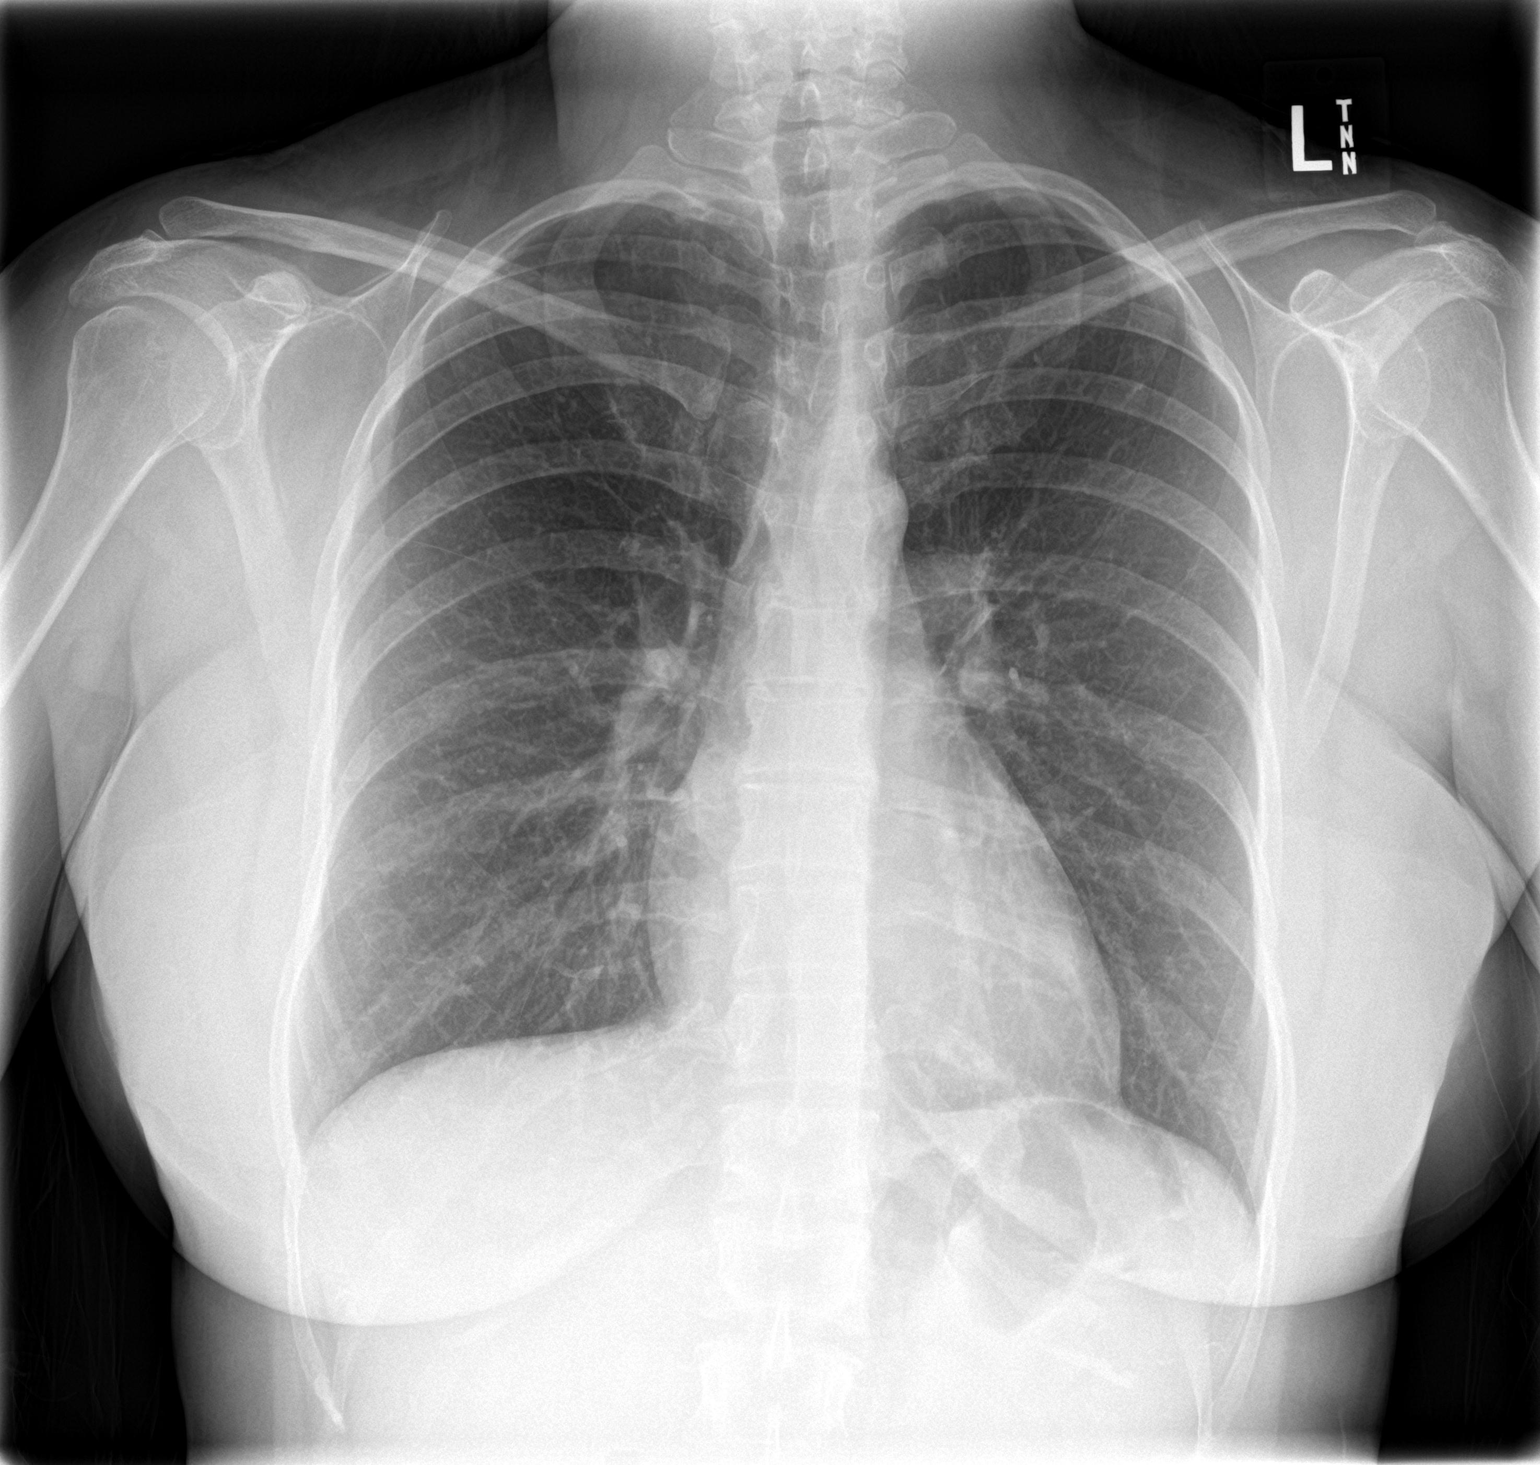

[chest lat]
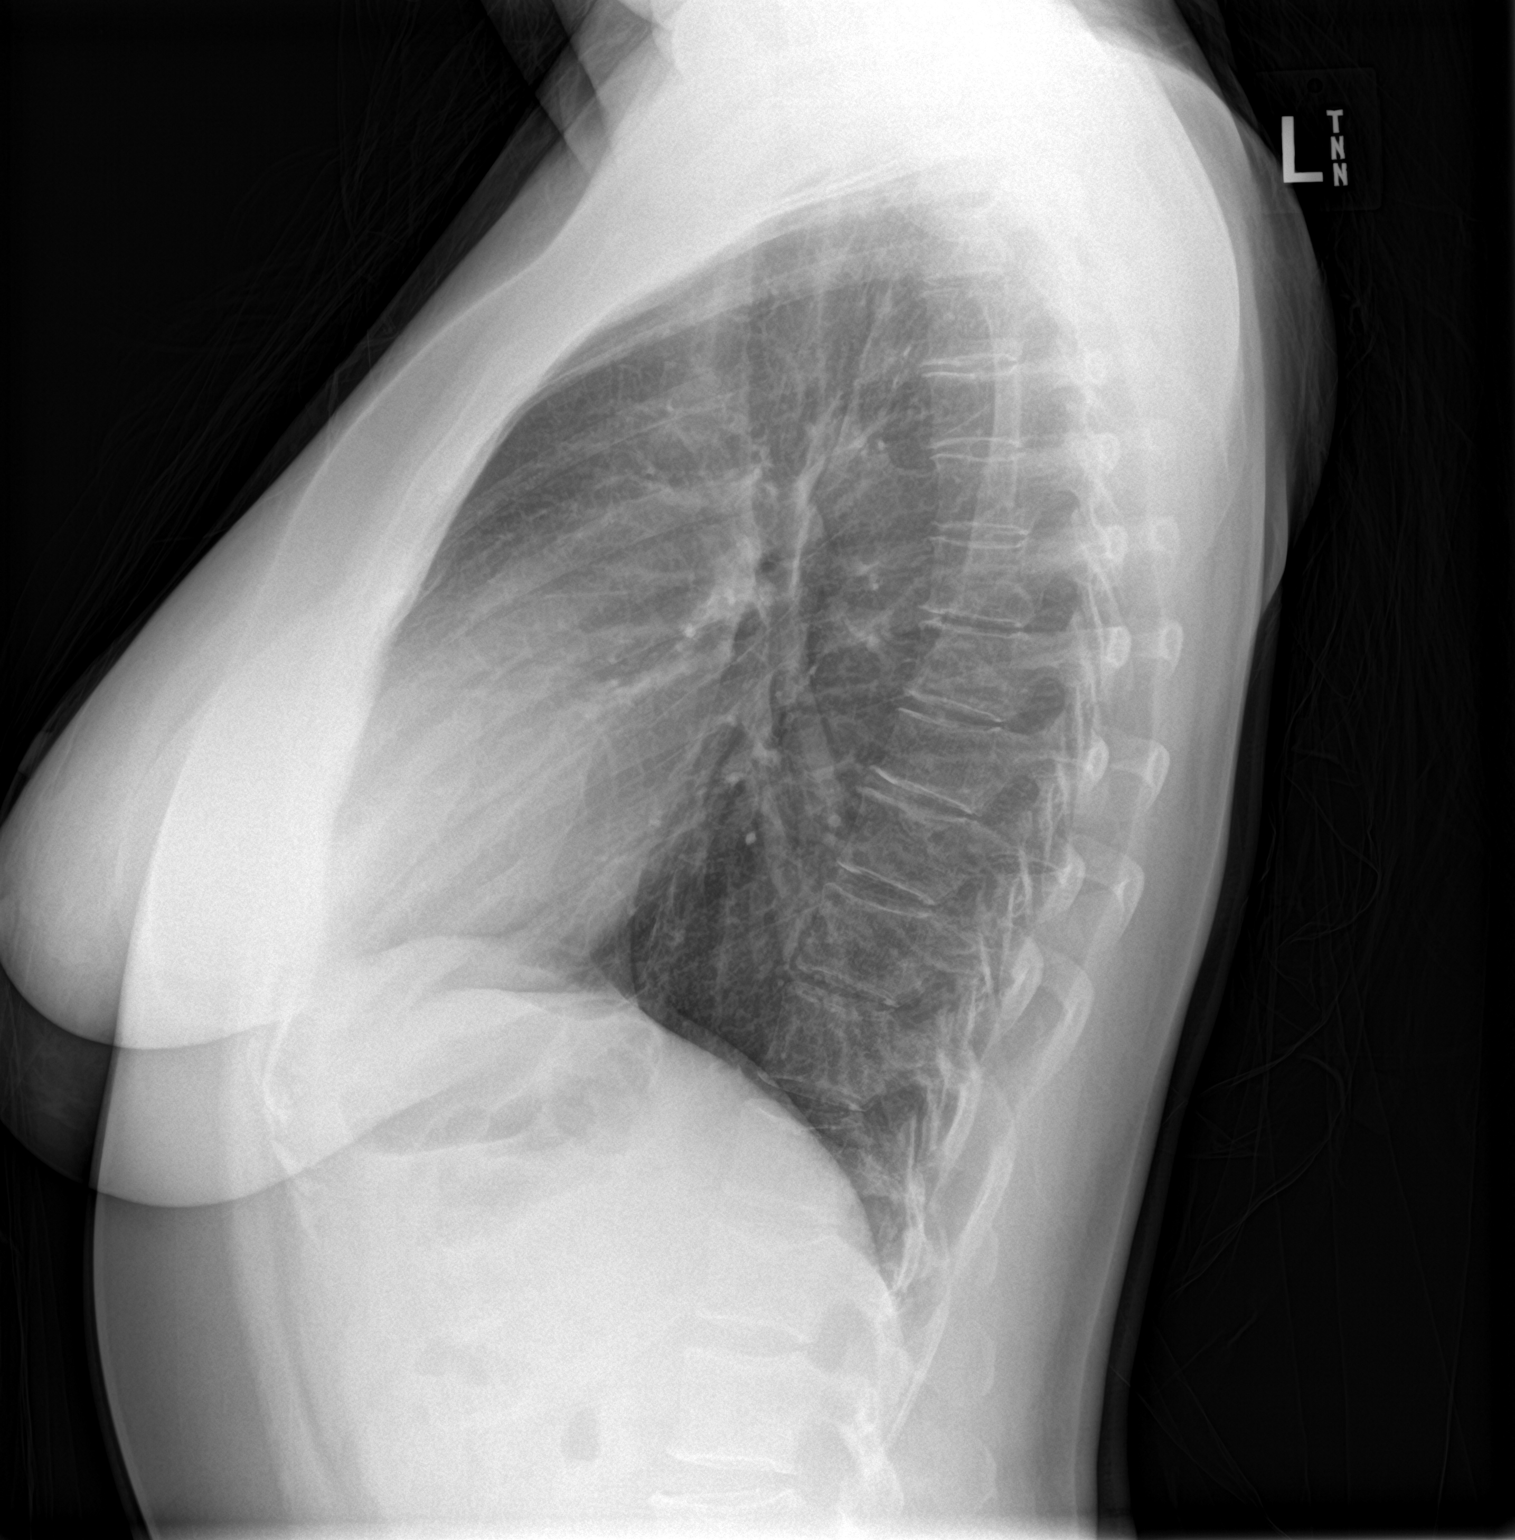

[2 of 2 positions shown; findings below may reference images not displayed]

FINDINGS: Normal cardiac silhouette and mediastinal contours. No focal
airspace opacities. No pleural effusion or pneumothorax. There is a
minimal amount of pleural parenchymal thickening about the
peripheral aspect the right minor fissure. No evidence of edema. No
acute osseus abnormalities.
IMPRESSION: No acute cardiopulmonary disease.

## 2018-03-31 ENCOUNTER — Encounter: Payer: Self-pay | Admitting: Nurse Practitioner

## 2018-03-31 ENCOUNTER — Ambulatory Visit: Payer: BLUE CROSS/BLUE SHIELD | Admitting: Nurse Practitioner

## 2018-03-31 VITALS — BP 94/60 | HR 73 | Temp 97.6°F | Ht 65.0 in | Wt 163.0 lb

## 2018-03-31 DIAGNOSIS — R05 Cough: Secondary | ICD-10-CM | POA: Diagnosis not present

## 2018-03-31 DIAGNOSIS — R059 Cough, unspecified: Secondary | ICD-10-CM

## 2018-03-31 MED ORDER — PANTOPRAZOLE SODIUM 40 MG PO TBEC: 40.0000 mg | DELAYED_RELEASE_TABLET | Freq: Every day | ORAL | 3 refills | Status: DC

## 2018-03-31 NOTE — Patient Instructions (Signed)
Food Choices for Gastroesophageal Reflux Disease, Adult When you have gastroesophageal reflux disease (GERD), the foods you eat and your eating habits are very important. Choosing the right foods can help ease your discomfort. What guidelines do I need to follow?  Choose fruits, vegetables, whole grains, and low-fat dairy products.  Choose low-fat meat, fish, and poultry.  Limit fats such as oils, salad dressings, butter, nuts, and avocado.  Keep a food diary. This helps you identify foods that cause symptoms.  Avoid foods that cause symptoms. These may be different for everyone.  Eat small meals often instead of 3 large meals a day.  Eat your meals slowly, in a place where you are relaxed.  Limit fried foods.  Cook foods using methods other than frying.  Avoid drinking alcohol.  Avoid drinking large amounts of liquids with your meals.  Avoid bending over or lying down until 2-3 hours after eating. What foods are not recommended? These are some foods and drinks that may make your symptoms worse: Vegetables  Tomatoes. Tomato juice. Tomato and spaghetti sauce. Chili peppers. Onion and garlic. Horseradish. Fruits  Oranges, grapefruit, and lemon (fruit and juice). Meats  High-fat meats, fish, and poultry. This includes hot dogs, ribs, ham, sausage, salami, and bacon. Dairy  Whole milk and chocolate milk. Sour cream. Cream. Butter. Ice cream. Cream cheese. Drinks  Coffee and tea. Bubbly (carbonated) drinks or energy drinks. Condiments  Hot sauce. Barbecue sauce. Sweets/Desserts  Chocolate and cocoa. Donuts. Peppermint and spearmint. Fats and Oils  High-fat foods. This includes French fries and potato chips. Other  Vinegar. Strong spices. This includes black pepper, white pepper, red pepper, cayenne, curry powder, cloves, ginger, and chili powder. The items listed above may not be a complete list of foods and drinks to avoid. Contact your dietitian for more information.    This information is not intended to replace advice given to you by your health care provider. Make sure you discuss any questions you have with your health care provider. Document Released: 11/10/2011 Document Revised: 10/17/2015 Document Reviewed: 03/15/2013 Elsevier Interactive Patient Education  2017 [employer] Inc[employer]  

## 2018-03-31 NOTE — Progress Notes (Signed)
   Subjective:    Patient ID: Kimberly Shaw, female    DOB: 1975/11/14, 42 y.o.   MRN: [id number]   Chief Complaint: Cough (2 months  Also has hiccups from time to time and worried it may be something with her esophagus)   HPI Patient presents with c/o cough for 2 months and spasm in esophagus for 2 months, more annoying than anything, has tried Prilosec a few months ago (before present symptoms occurred, for possible reflux but did not feel any different), negative for postnasal drip & sore throat, cough is non-productive, cough worse at night, no difficulty swallowing, has not taken anything for cough. No history of asthma/copd, only occasional seasonal allergies, for which she has not taken OTC meds    Review of Systems  Constitutional: Negative for chills and fever.  HENT: Negative.  Negative for congestion, rhinorrhea, sneezing, sore throat and trouble swallowing.   Eyes: Negative.   Respiratory: Positive for cough.   Cardiovascular: Negative.   Gastrointestinal: Negative.   Genitourinary: Negative.   Musculoskeletal: Negative.   Skin: Negative.   Allergic/Immunologic: Positive for environmental allergies.  Neurological: Negative.   Psychiatric/Behavioral: Negative.        Objective:   Physical Exam  Constitutional: She is oriented to person, place, and time. She appears well-developed and well-nourished.  HENT:  Head: Normocephalic and atraumatic.  Right Ear: Tympanic membrane and external ear normal.  Left Ear: Tympanic membrane and external ear normal.  Nose: Nose normal.  Mouth/Throat: Uvula is midline and oropharynx is clear and moist.  Eyes: Pupils are equal, round, and reactive to light.  Neck: Neck supple.  Cardiovascular: Normal rate.  Pulmonary/Chest: Effort normal.  Abdominal: Soft.  Neurological: She is alert and oriented to person, place, and time.  Skin: Skin is warm and dry.  Psychiatric: She has a normal mood and affect. Her behavior is normal.   BP  94/60   Pulse 73   Temp 97.6 F (36.4 C) (Oral)   Ht 5\' 5"  (1.651 m)   Wt 163 lb (73.9 kg)   BMI 27.12 kg/m   Office spirometry WNL    Assessment & Plan:  Kimberly Shaw in today with chief complaint of Cough (2 months  Also has hiccups from time to time and worried it may be something with her esophagus)   1. Cough in adult - PR BREATHING CAPACITY TEST - pantoprazole (PROTONIX) 40 MG tablet; Take 1 tablet (40 mg total) by mouth daily.  Dispense: 30 tablet; Refill: 3 Avoid spicy foods Do not eat 2 hours prior to bedtime  Mary-Margaret Daphine Deutscher, FNP   RTO or call in 2 weeks if symptoms not improved, may need GI referral

## 2018-04-02 ENCOUNTER — Telehealth: Payer: Self-pay | Admitting: Family

## 2018-04-02 NOTE — Telephone Encounter (Addendum)
Will route to [facility] to advise.  I am unaware of any labs needed before starting protonix.

## 2018-04-05 NOTE — Telephone Encounter (Signed)
It is safe to start Protonix. Pt could schedule a physical and get lab work completed.

## 2018-04-05 NOTE — Telephone Encounter (Signed)
Pt notified ok to start Protonix Verbalizes understanding

## 2018-04-19 ENCOUNTER — Ambulatory Visit: Payer: BLUE CROSS/BLUE SHIELD | Admitting: Family

## 2018-04-19 ENCOUNTER — Encounter: Payer: Self-pay | Admitting: Family

## 2018-04-19 VITALS — BP 94/62 | HR 80 | Temp 98.7°F | Ht 65.0 in | Wt 165.4 lb

## 2018-04-19 DIAGNOSIS — R059 Cough, unspecified: Secondary | ICD-10-CM

## 2018-04-19 DIAGNOSIS — Z0001 Encounter for general adult medical examination with abnormal findings: Secondary | ICD-10-CM

## 2018-04-19 DIAGNOSIS — K219 Gastro-esophageal reflux disease without esophagitis: Secondary | ICD-10-CM

## 2018-04-19 DIAGNOSIS — R05 Cough: Secondary | ICD-10-CM | POA: Diagnosis not present

## 2018-04-19 DIAGNOSIS — Z0000 Encounter for general adult medical examination without abnormal findings: Secondary | ICD-10-CM

## 2018-04-19 DIAGNOSIS — E039 Hypothyroidism, unspecified: Secondary | ICD-10-CM

## 2018-04-19 DIAGNOSIS — J029 Acute pharyngitis, unspecified: Secondary | ICD-10-CM

## 2018-04-19 LAB — RAPID STREP SCREEN (MED CTR MEBANE ONLY): Strep Gp A Ag, IA W/Reflex: NEGATIVE

## 2018-04-19 LAB — CULTURE, GROUP A STREP

## 2018-04-19 NOTE — Progress Notes (Signed)
Subjective:    Patient ID: Kimberly Shaw, female    DOB: 04-30-1976, 42 y.o.   MRN: [id number]  Chief Complaint  Patient presents with  . Annual Exam    recheck acid reflux   PT presents to the office today for CPE without pap. Pt had pap 08/09/17. She was started on protonix about two weeks ago for a cough that started two month prior. Thyroid Problem  Presents for follow-up visit. Symptoms include hoarse voice. Patient reports no constipation or diarrhea.  Gastroesophageal Reflux  She complains of coughing, heartburn, a hoarse voice and nausea. She reports no sore throat. This is a recurrent problem. The current episode started more than 1 month ago. The problem occurs occasionally. The problem has been waxing and waning. She has tried a PPI for the symptoms. The treatment provided mild relief.  Sore Throat   This is a new problem. The current episode started yesterday. The problem has been waxing and waning. The pain is mild. Associated symptoms include coughing and a hoarse voice. Pertinent negatives include no diarrhea. She has tried NSAIDs for the symptoms.      Review of Systems  HENT: Positive for hoarse voice. Negative for sore throat.   Respiratory: Positive for cough.   Gastrointestinal: Positive for heartburn and nausea. Negative for constipation and diarrhea.  All other systems reviewed and are negative.      Objective:   Physical Exam  Constitutional: She is oriented to person, place, and time. She appears well-developed and well-nourished. No distress.  HENT:  Head: Normocephalic and atraumatic.  Right Ear: External ear normal.  Left Ear: External ear normal.  Mouth/Throat: Oropharynx is clear and moist. Tonsils are 2+ on the left. Tonsillar exudate.  Eyes: Pupils are equal, round, and reactive to light.  Neck: Normal range of motion. Neck supple. No thyromegaly present.  Cardiovascular: Normal rate, regular rhythm, normal heart sounds and intact distal pulses.    No murmur heard. Pulmonary/Chest: Effort normal and breath sounds normal. No respiratory distress. She has no wheezes.  Abdominal: Soft. Bowel sounds are normal. She exhibits no distension. There is no tenderness.  Musculoskeletal: Normal range of motion. She exhibits no edema or tenderness.  Neurological: She is alert and oriented to person, place, and time. She has normal reflexes. No cranial nerve deficit.  Skin: Skin is warm and dry.  Psychiatric: She has a normal mood and affect. Her behavior is normal. Judgment and thought content normal.  Vitals reviewed.   BP 94/62   Pulse 80   Temp 98.7 F (37.1 C) (Oral)   Ht _0  (1.651 m)   Wt 165 lb 6.4 oz (75 kg)   BMI 27.52 kg/m      Assessment & Plan:  Kimberly Shaw comes in today with chief complaint of Annual Exam (recheck acid reflux)   Diagnosis and orders addressed:  1. Annual physical exam - CMP14+EGFR - CBC with Differential/Platelet - Lipid panel - TSH - Magnesium  2. Hypothyroidism, unspecified type - CMP14+EGFR - CBC with Differential/Platelet  3. Cough Continue Protonix  Start zyrtec daily - CMP14+EGFR - CBC with Differential/Platelet  4. Gastroesophageal reflux disease, esophagitis presence not specified -Diet discussed- Avoid fried, spicy, citrus foods, caffeine and alcohol -Do not eat 2-3 hours before bedtime -Encouraged small frequent meals -Avoid NSAID's - CMP14+EGFR - CBC with Differential/Platelet - Magnesium  5. Sore throat Strep negative - CMP14+EGFR - CBC with Differential/Platelet - Rapid Strep Screen ([facility] ONLY)   Labs pending  Health Maintenance reviewed Diet and exercise encouraged  Follow up plan: 1 year   Evelina Dun, FNP

## 2018-04-19 NOTE — Patient Instructions (Signed)

## 2018-04-20 LAB — CMP14+EGFR
ALBUMIN: 4.1 g/dL (ref 3.5–5.5)
ALT: 22 IU/L (ref 0–32)
AST: 18 IU/L (ref 0–40)
Albumin/Globulin Ratio: 1.8 (ref 1.2–2.2)
Alkaline Phosphatase: 75 IU/L (ref 39–117)
BUN / CREAT RATIO: 14 (ref 9–23)
BUN: 10 mg/dL (ref 6–24)
Bilirubin Total: 0.5 mg/dL (ref 0.0–1.2)
CHLORIDE: 101 mmol/L (ref 96–106)
CO2: 22 mmol/L (ref 20–29)
CREATININE: 0.74 mg/dL (ref 0.57–1.00)
Calcium: 8.9 mg/dL (ref 8.7–10.2)
GFR calc non Af Amer: 99 mL/min/{1.73_m2} (ref >59)
GFR, EST AFRICAN AMERICAN: 114 mL/min/{1.73_m2} (ref >59)
GLUCOSE: 90 mg/dL (ref 65–99)
Globulin, Total: 2.3 g/dL (ref 1.5–4.5)
Potassium: 4.1 mmol/L (ref 3.5–5.2)
Sodium: 138 mmol/L (ref 134–144)
TOTAL PROTEIN: 6.4 g/dL (ref 6.0–8.5)

## 2018-04-20 LAB — CBC WITH DIFFERENTIAL/PLATELET
BASOS ABS: 0 10*3/uL (ref 0.0–0.2)
Basos: 0 %
EOS (ABSOLUTE): 0.1 10*3/uL (ref 0.0–0.4)
Eos: 1 %
HEMATOCRIT: 39.5 % (ref 34.0–46.6)
HEMOGLOBIN: 13.5 g/dL (ref 11.1–15.9)
Immature Grans (Abs): 0 10*3/uL (ref 0.0–0.1)
Immature Granulocytes: 0 %
LYMPHS ABS: 1.6 10*3/uL (ref 0.7–3.1)
Lymphs: 16 %
MCH: 30.8 pg (ref 26.6–33.0)
MCHC: 34.2 g/dL (ref 31.5–35.7)
MCV: 90 fL (ref 79–97)
MONOS ABS: 0.5 10*3/uL (ref 0.1–0.9)
Monocytes: 5 %
NEUTROS ABS: 7.7 10*3/uL — AB (ref 1.4–7.0)
Neutrophils: 78 %
Platelets: 221 10*3/uL (ref 150–450)
RBC: 4.38 x10E6/uL (ref 3.77–5.28)
RDW: 12.2 % — AB (ref 12.3–15.4)
WBC: 9.9 10*3/uL (ref 3.4–10.8)

## 2018-04-20 LAB — LIPID PANEL
CHOL/HDL RATIO: 3.1 ratio (ref 0.0–4.4)
CHOLESTEROL TOTAL: 163 mg/dL (ref 100–199)
HDL: 53 mg/dL (ref >39)
LDL CALC: 94 mg/dL (ref 0–99)
Triglycerides: 78 mg/dL (ref 0–149)
VLDL Cholesterol Cal: 16 mg/dL (ref 5–40)

## 2018-04-20 LAB — MAGNESIUM: MAGNESIUM: 1.6 mg/dL (ref 1.6–2.3)

## 2018-04-20 LAB — TSH: TSH: 3.48 u[IU]/mL (ref 0.450–4.500)

## 2018-07-05 ENCOUNTER — Ambulatory Visit: Payer: BLUE CROSS/BLUE SHIELD | Admitting: Family Medicine

## 2018-07-05 ENCOUNTER — Encounter: Payer: Self-pay | Admitting: Family Medicine

## 2018-07-05 VITALS — BP 100/69 | HR 75 | Temp 98.9°F | Ht 65.0 in | Wt 162.1 lb

## 2018-07-05 DIAGNOSIS — R52 Pain, unspecified: Secondary | ICD-10-CM

## 2018-07-05 DIAGNOSIS — J111 Influenza due to unidentified influenza virus with other respiratory manifestations: Secondary | ICD-10-CM

## 2018-07-05 LAB — VERITOR FLU A/B WAIVED
INFLUENZA A: NEGATIVE
Influenza B: NEGATIVE

## 2018-07-05 MED ORDER — OSELTAMIVIR PHOSPHATE 75 MG PO CAPS: 75.0000 mg | ORAL_CAPSULE | Freq: Two times a day (BID) | ORAL | 0 refills | Status: DC

## 2018-07-05 NOTE — Progress Notes (Signed)
Chief Complaint  Patient presents with  . Generalized Body Aches    pt here today c/o aches, low grade fever and cough    HPI  Patient presents today for patient presents with dry cough runny stuffy nose. Diffuse headache of moderate intensity. Patient also has chills and subjective fever. Body aches worst in the back and hips. Has sapped the energy as well. Onset yesterday   PMH: Smoking status noted ROS: Per HPI  Objective: BP 100/69   Pulse 75   Temp 98.9 F (37.2 C) (Oral)   Ht 5\' 5"  (1.651 m)   Wt 162 lb 2 oz (73.5 kg)   BMI 26.98 kg/m  Gen: NAD, alert, cooperative with exam HEENT: NCAT, EOMI, PERRL CV: RRR, good S1/S2, no murmur Resp: CTABL, no wheezes, non-labored Abd: SNTND, BS present, no guarding or organomegaly Ext: No edema, warm Neuro: Alert and oriented, No gross deficits  Assessment and plan:  1. Influenza with respiratory manifestation   2. Body aches     Meds ordered this encounter  Medications  . oseltamivir (TAMIFLU) 75 MG capsule    Sig: Take 1 capsule (75 mg total) by mouth 2 (two) times daily.    Dispense:  10 capsule    Refill:  0    Orders Placed This Encounter  Procedures  . Veritor Flu A/B Waived    Order Specific Question:   Source    Answer:   nasal    Follow up as needed.  Mechele Claude, MD

## 2018-07-08 ENCOUNTER — Telehealth: Payer: Self-pay | Admitting: Family

## 2018-07-08 ENCOUNTER — Other Ambulatory Visit: Payer: Self-pay | Admitting: Family Medicine

## 2018-07-08 MED ORDER — BENZONATATE 200 MG PO CAPS: 200.0000 mg | ORAL_CAPSULE | Freq: Three times a day (TID) | ORAL | 0 refills | Status: DC | PRN

## 2018-07-08 NOTE — Telephone Encounter (Signed)
PT has called back checking to see if anything could be sent in for her cough

## 2018-07-08 NOTE — Telephone Encounter (Signed)
Benzonatate scrip sent in.

## 2018-07-08 NOTE — Telephone Encounter (Signed)
Pharmacy [facility] [place]   PT was seen 2/11 by Dr Darlyn Read and states that her cough wasn't that bad, now her cough has gotten worse and is requesting something be sent in for her cough.  Pt states that: Cough Syrup works best at night The pearls work good for during the day.

## 2018-07-08 NOTE — Telephone Encounter (Signed)
Aware. 

## 2018-11-10 DIAGNOSIS — E039 Hypothyroidism, unspecified: Secondary | ICD-10-CM | POA: Diagnosis not present

## 2018-11-10 DIAGNOSIS — Z01419 Encounter for gynecological examination (general) (routine) without abnormal findings: Secondary | ICD-10-CM | POA: Diagnosis not present

## 2018-11-10 DIAGNOSIS — Z6826 Body mass index (BMI) 26.0-26.9, adult: Secondary | ICD-10-CM | POA: Diagnosis not present

## 2019-02-02 DIAGNOSIS — E039 Hypothyroidism, unspecified: Secondary | ICD-10-CM | POA: Diagnosis not present

## 2019-05-09 DIAGNOSIS — Z1231 Encounter for screening mammogram for malignant neoplasm of breast: Secondary | ICD-10-CM | POA: Diagnosis not present

## 2019-05-31 DIAGNOSIS — R928 Other abnormal and inconclusive findings on diagnostic imaging of breast: Secondary | ICD-10-CM | POA: Diagnosis not present

## 2019-08-16 ENCOUNTER — Encounter: Payer: Self-pay | Admitting: Family Medicine

## 2019-08-16 ENCOUNTER — Other Ambulatory Visit: Payer: Self-pay

## 2019-08-16 ENCOUNTER — Ambulatory Visit: Payer: BC Managed Care – PPO | Admitting: Family Medicine

## 2019-08-16 VITALS — BP 101/67 | HR 72 | Temp 98.6°F | Ht 65.0 in | Wt 170.2 lb

## 2019-08-16 DIAGNOSIS — M545 Low back pain, unspecified: Secondary | ICD-10-CM

## 2019-08-16 DIAGNOSIS — E039 Hypothyroidism, unspecified: Secondary | ICD-10-CM | POA: Diagnosis not present

## 2019-08-16 DIAGNOSIS — R252 Cramp and spasm: Secondary | ICD-10-CM

## 2019-08-16 LAB — MICROSCOPIC EXAMINATION
Bacteria, UA: NONE SEEN
Renal Epithel, UA: NONE SEEN /hpf

## 2019-08-16 LAB — URINALYSIS, COMPLETE
Bilirubin, UA: NEGATIVE
Glucose, UA: NEGATIVE
Ketones, UA: NEGATIVE
Leukocytes,UA: NEGATIVE
Nitrite, UA: NEGATIVE
Protein,UA: NEGATIVE
Specific Gravity, UA: 1.025 (ref 1.005–1.030)
Urobilinogen, Ur: 0.2 mg/dL (ref 0.2–1.0)
pH, UA: 6 (ref 5.0–7.5)

## 2019-08-16 MED ORDER — PREDNISONE 10 MG (21) PO TBPK: ORAL_TABLET | ORAL | 0 refills | Status: DC

## 2019-08-16 NOTE — Patient Instructions (Addendum)
Low Back Sprain or Strain Rehab Ask your health care provider which exercises are safe for you. Do exercises exactly as told by your health care provider and adjust them as directed. It is normal to feel mild stretching, pulling, tightness, or discomfort as you do these exercises. Stop right away if you feel sudden pain or your pain gets worse. Do not begin these exercises until told by your health care provider. Stretching and range-of-motion exercises These exercises warm up your muscles and joints and improve the movement and flexibility of your back. These exercises also help to relieve pain, numbness, and tingling. Lumbar rotation  1. Lie on your back on a firm surface and bend your knees. 2. Straighten your arms out to your sides so each arm forms a 90-degree angle (right angle) with a side of your body. 3. Slowly move (rotate) both of your knees to one side of your body until you feel a stretch in your lower back (lumbar). Try not to let your shoulders lift off the floor. 4. Hold this position for __________ seconds. 5. Tense your abdominal muscles and slowly move your knees back to the starting position. 6. Repeat this exercise on the other side of your body. Repeat __________ times. Complete this exercise __________ times a day. Single knee to chest  1. Lie on your back on a firm surface with both legs straight. 2. Bend one of your knees. Use your hands to move your knee up toward your chest until you feel a gentle stretch in your lower back and buttock. ? Hold your leg in this position by holding on to the front of your knee. ? Keep your other leg as straight as possible. 3. Hold this position for __________ seconds. 4. Slowly return to the starting position. 5. Repeat with your other leg. Repeat __________ times. Complete this exercise __________ times a day. Prone extension on elbows  1. Lie on your abdomen on a firm surface (prone position). 2. Prop yourself up on your  elbows. 3. Use your arms to help lift your chest up until you feel a gentle stretch in your abdomen and your lower back. ? This will place some of your body weight on your elbows. If this is uncomfortable, try stacking pillows under your chest. ? Your hips should stay down, against the surface that you are lying on. Keep your hip and back muscles relaxed. 4. Hold this position for __________ seconds. 5. Slowly relax your upper body and return to the starting position. Repeat __________ times. Complete this exercise __________ times a day. Strengthening exercises These exercises build strength and endurance in your back. Endurance is the ability to use your muscles for a long time, even after they get tired. Pelvic tilt This exercise strengthens the muscles that lie deep in the abdomen. 1. Lie on your back on a firm surface. Bend your knees and keep your feet flat on the floor. 2. Tense your abdominal muscles. Tip your pelvis up toward the ceiling and flatten your lower back into the floor. ? To help with this exercise, you may place a small towel under your lower back and try to push your back into the towel. 3. Hold this position for __________ seconds. 4. Let your muscles relax completely before you repeat this exercise. Repeat __________ times. Complete this exercise __________ times a day. Alternating arm and leg raises  1. Get on your hands and knees on a firm surface. If you are on a hard floor, you   may want to use padding, such as an exercise mat, to cushion your knees. 2. Line up your arms and legs. Your hands should be directly below your shoulders, and your knees should be directly below your hips. 3. Lift your left leg behind you. At the same time, raise your right arm and straighten it in front of you. ? Do not lift your leg higher than your hip. ? Do not lift your arm higher than your shoulder. ? Keep your abdominal and back muscles tight. ? Keep your hips facing the  ground. ? Do not arch your back. ? Keep your balance carefully, and do not hold your breath. 4. Hold this position for __________ seconds. 5. Slowly return to the starting position. 6. Repeat with your right leg and your left arm. Repeat __________ times. Complete this exercise __________ times a day. Abdominal set with straight leg raise  1. Lie on your back on a firm surface. 2. Bend one of your knees and keep your other leg straight. 3. Tense your abdominal muscles and lift your straight leg up, 4-6 inches (10-15 cm) off the ground. 4. Keep your abdominal muscles tight and hold this position for __________ seconds. ? Do not hold your breath. ? Do not arch your back. Keep it flat against the ground. 5. Keep your abdominal muscles tense as you slowly lower your leg back to the starting position. 6. Repeat with your other leg. Repeat __________ times. Complete this exercise __________ times a day. Single leg lower with bent knees 1. Lie on your back on a firm surface. 2. Tense your abdominal muscles and lift your feet off the floor, one foot at a time, so your knees and hips are bent in 90-degree angles (right angles). ? Your knees should be over your hips and your lower legs should be parallel to the floor. 3. Keeping your abdominal muscles tense and your knee bent, slowly lower one of your legs so your toe touches the ground. 4. Lift your leg back up to return to the starting position. ? Do not hold your breath. ? Do not let your back arch. Keep your back flat against the ground. 5. Repeat with your other leg. Repeat __________ times. Complete this exercise __________ times a day. Posture and body mechanics Good posture and healthy body mechanics can help to relieve stress in your body's tissues and joints. Body mechanics refers to the movements and positions of your body while you do your daily activities. Posture is part of body mechanics. Good posture means:  Your spine is in its  natural S-curve position (neutral).  Your shoulders are pulled back slightly.  Your head is not tipped forward. Follow these guidelines to improve your posture and body mechanics in your everyday activities. Standing   When standing, keep your spine neutral and your feet about hip width apart. Keep a slight bend in your knees. Your ears, shoulders, and hips should line up.  When you do a task in which you stand in one place for a long time, place one foot up on a stable object that is 2-4 inches (5-10 cm) high, such as a footstool. This helps keep your spine neutral. Sitting   When sitting, keep your spine neutral and keep your feet flat on the floor. Use a footrest, if necessary, and keep your thighs parallel to the floor. Avoid rounding your shoulders, and avoid tilting your head forward.  When working at a desk or a computer, keep your desk   at a height where your hands are slightly lower than your elbows. Slide your chair under your desk so you are close enough to maintain good posture.  When working at a computer, place your monitor at a height where you are looking straight ahead and you do not have to tilt your head forward or downward to look at the screen. Resting  When lying down and resting, avoid positions that are most painful for you.  If you have pain with activities such as sitting, bending, stooping, or squatting, lie in a position in which your body does not bend very much. For example, avoid curling up on your side with your arms and knees near your chest (fetal position).  If you have pain with activities such as standing for a long time or reaching with your arms, lie with your spine in a neutral position and bend your knees slightly. Try the following positions: ? Lying on your side with a pillow between your knees. ? Lying on your back with a pillow under your knees. Lifting   When lifting objects, keep your feet at least shoulder width apart and tighten your  abdominal muscles.  Bend your knees and hips and keep your spine neutral. It is important to lift using the strength of your legs, not your back. Do not lock your knees straight out.  Always ask for help to lift heavy or awkward objects. This information is not intended to replace advice given to you by your health care provider. Make sure you discuss any questions you have with your health care provider. Document Revised: 09/02/2018 Document Reviewed: 06/02/2018 Elsevier Patient Education  2020 [employer]   Muscle Cramps and Spasms Muscle cramps and spasms are when muscles tighten by themselves. They usually get better within minutes. Muscle cramps are painful. They are usually stronger and last longer than muscle spasms. Muscle spasms may or may not be painful. They can last a few seconds or much longer. Cramps and spasms can affect any muscle, but they occur most often in the calf muscles of the leg. They are usually not caused by a serious problem. In many cases, the cause is not known. Some common causes include:  Doing more physical work or exercise than your body is ready for.  Using the muscles too much (overuse) by repeating certain movements too many times.  Staying in a certain position for a long time.  Playing a sport or doing an activity without preparing properly.  Using bad form or technique while playing a sport or doing an activity.  Not having enough water in your body (dehydration).  Injury.  Side effects of some medicines.  Low levels of the salts and minerals in your blood (electrolytes), such as low potassium or calcium. Follow these instructions at home: Managing pain and stiffness      Massage, stretch, and relax the muscle. Do this for many minutes at a time.  If told, put heat on tight or tense muscles as often as told by your doctor. Use the heat source that your doctor recommends, such as a moist heat pack or a heating pad. ? Place a towel  between your skin and the heat source. ? Leave the heat on for 20-30 minutes. ? Remove the heat if your skin turns bright red. This is very important if you are not able to feel pain, heat, or cold. You may have a greater risk of getting burned.  If told, put ice on the affected  area. This may help if you are sore or have pain after a cramp or spasm. ? Put ice in a plastic bag. ? Place a towel between your skin and the bag. ? Leave the ice on for 20 minutes, 2-3 times a day.  Try taking hot showers or baths to help relax tight muscles. Eating and drinking  Drink enough fluid to keep your pee (urine) pale yellow.  Eat a healthy diet to help ensure that your muscles work well. This should include: ? Fruits and vegetables. ? Lean protein. ? Whole grains. ? Low-fat or nonfat dairy products. General instructions  If you are having cramps often, avoid intense exercise for several days.  Take over-the-counter and prescription medicines only as told by your doctor.  Watch for any changes in your symptoms.  Keep all follow-up visits as told by your doctor. This is important. Contact a doctor if:  Your cramps or spasms get worse or happen more often.  Your cramps or spasms do not get better with time. Summary  Muscle cramps and spasms are when muscles tighten by themselves. They usually get better within minutes.  Cramps and spasms occur most often in the calf muscles of the leg.  Massage, stretch, and relax the muscle. This may help the cramp or spasm go away.  Drink enough fluid to keep your pee (urine) pale yellow. This information is not intended to replace advice given to you by your health care provider. Make sure you discuss any questions you have with your health care provider. Document Revised: 10/04/2017 Document Reviewed: 10/04/2017 Elsevier Patient Education  [place].

## 2019-08-16 NOTE — Progress Notes (Signed)
Assessment & Plan:  1. Acute left-sided low back pain without sciatica - Exercises provided to complete. Encouraged to get up and move around while working from home. Tylenol/Ibuprofen, muscle rubs, heating pad, and patches as needed.  - Urinalysis, Complete: negative - predniSONE (STERAPRED UNI-PAK 21 TAB) 10 MG (21) TBPK tablet; As directed x 6 days  Dispense: 21 tablet; Refill: 0  2. Cramp of toe - Education provided on muscle cramps. - CMP14+EGFR - TSH - Magnesium   Follow up plan: Return if symptoms worsen or fail to improve.  Hendricks Limes, MSN, APRN, FNP-C [facility]  Subjective:   Patient ID: Kimberly Shaw, female    DOB: 08-Dec-1975, 44 y.o.   MRN: [id number]  HPI: Kimberly Shaw is a 44 y.o. female presenting on 08/16/2019 for Back Pain (x 2 days) and Foot Pain (right- x 2 days)  Low Back Pain: Patient complains of acute low back pain. The patient first noted symptoms 2 days ago. Patient reports she does a lot of sitting now that she is working from home. The pain is rated 6 /10, and is located at the left lumbar area. The pain is described as a tension clamp. Symptoms are exacerbated by standing. Factors which relieve the pain include NSAIDs. Other associated symptoms include no other symptoms.   She also reports at the same time as her back pain she started having right heel pain that extends into her arch. It is not present currently.    ROS: Negative unless specifically indicated above in HPI.   Relevant past medical history reviewed and updated as indicated.   Allergies and medications reviewed and updated.   Current Outpatient Medications:  .  levothyroxine (SYNTHROID, LEVOTHROID) 75 MCG tablet, Take 75 mcg by mouth daily before breakfast., Disp: , Rfl:  .  predniSONE (STERAPRED UNI-PAK 21 TAB) 10 MG (21) TBPK tablet, As directed x 6 days, Disp: 21 tablet, Rfl: 0  Allergies  Allergen Reactions  . Bee Venom     Swelling   . Mobic  [Meloxicam] Other (See Comments)    Burning in throat    Objective:   BP 101/67   Pulse 72   Temp 98.6 F (37 C) (Temporal)   Ht 5' 5"  (1.651 m)   Wt 170 lb 3.2 oz (77.2 kg)   LMP 08/02/2019 (Approximate)   SpO2 98%   BMI 28.32 kg/m    Physical Exam Vitals reviewed.  Constitutional:      General: She is not in acute distress.    Appearance: Normal appearance. She is overweight. She is not ill-appearing, toxic-appearing or diaphoretic.  HENT:     Head: Normocephalic and atraumatic.  Eyes:     General: No scleral icterus.       Right eye: No discharge.        Left eye: No discharge.     Conjunctiva/sclera: Conjunctivae normal.  Cardiovascular:     Rate and Rhythm: Normal rate.  Pulmonary:     Effort: Pulmonary effort is normal. No respiratory distress.  Musculoskeletal:        General: Normal range of motion.     Cervical back: Normal range of motion.     Lumbar back: Tenderness present. No swelling, edema, deformity, signs of trauma, lacerations, spasms or bony tenderness. Normal range of motion. No scoliosis.  Skin:    General: Skin is warm and dry.     Capillary Refill: Capillary refill takes less than 2 seconds.  Neurological:  General: No focal deficit present.     Mental Status: She is alert and oriented to person, place, and time. Mental status is at baseline.  Psychiatric:        Mood and Affect: Mood normal.        Behavior: Behavior normal.        Thought Content: Thought content normal.        Judgment: Judgment normal.

## 2019-08-17 LAB — CMP14+EGFR
ALT: 14 IU/L (ref 0–32)
AST: 16 IU/L (ref 0–40)
Albumin/Globulin Ratio: 1.7 (ref 1.2–2.2)
Albumin: 4.1 g/dL (ref 3.8–4.8)
Alkaline Phosphatase: 77 IU/L (ref 39–117)
BUN/Creatinine Ratio: 16 (ref 9–23)
BUN: 12 mg/dL (ref 6–24)
Bilirubin Total: 0.3 mg/dL (ref 0.0–1.2)
CO2: 23 mmol/L (ref 20–29)
Calcium: 8.7 mg/dL (ref 8.7–10.2)
Chloride: 105 mmol/L (ref 96–106)
Creatinine, Ser: 0.74 mg/dL (ref 0.57–1.00)
GFR calc Af Amer: 113 mL/min/{1.73_m2} (ref >59)
GFR calc non Af Amer: 98 mL/min/{1.73_m2} (ref >59)
Globulin, Total: 2.4 g/dL (ref 1.5–4.5)
Glucose: 91 mg/dL (ref 65–99)
Potassium: 4.2 mmol/L (ref 3.5–5.2)
Sodium: 140 mmol/L (ref 134–144)
Total Protein: 6.5 g/dL (ref 6.0–8.5)

## 2019-08-17 LAB — TSH: TSH: 3.54 u[IU]/mL (ref 0.450–4.500)

## 2019-08-17 LAB — MAGNESIUM: Magnesium: 1.9 mg/dL (ref 1.6–2.3)

## 2020-04-24 ENCOUNTER — Ambulatory Visit: Payer: BC Managed Care – PPO | Admitting: Family Medicine

## 2020-04-24 DIAGNOSIS — R42 Dizziness and giddiness: Secondary | ICD-10-CM | POA: Diagnosis not present

## 2020-04-24 DIAGNOSIS — R110 Nausea: Secondary | ICD-10-CM | POA: Diagnosis not present

## 2020-04-24 DIAGNOSIS — Z6829 Body mass index (BMI) 29.0-29.9, adult: Secondary | ICD-10-CM | POA: Diagnosis not present

## 2020-04-24 DIAGNOSIS — H8309 Labyrinthitis, unspecified ear: Secondary | ICD-10-CM | POA: Diagnosis not present

## 2020-11-05 DIAGNOSIS — Z131 Encounter for screening for diabetes mellitus: Secondary | ICD-10-CM | POA: Diagnosis not present

## 2020-11-05 DIAGNOSIS — E039 Hypothyroidism, unspecified: Secondary | ICD-10-CM | POA: Diagnosis not present

## 2020-11-05 DIAGNOSIS — Z01419 Encounter for gynecological examination (general) (routine) without abnormal findings: Secondary | ICD-10-CM | POA: Diagnosis not present

## 2020-11-05 DIAGNOSIS — Z13220 Encounter for screening for lipoid disorders: Secondary | ICD-10-CM | POA: Diagnosis not present

## 2021-01-31 DIAGNOSIS — Z1231 Encounter for screening mammogram for malignant neoplasm of breast: Secondary | ICD-10-CM | POA: Diagnosis not present

## 2021-02-15 ENCOUNTER — Encounter: Payer: Self-pay | Admitting: Emergency Medicine

## 2021-02-15 ENCOUNTER — Other Ambulatory Visit: Payer: Self-pay

## 2021-02-15 ENCOUNTER — Ambulatory Visit
Admission: EM | Admit: 2021-02-15 | Discharge: 2021-02-15 | Disposition: A | Discharge status: Home / Self Care | Payer: BC Managed Care – PPO | Attending: Emergency Medicine | Admitting: Emergency Medicine

## 2021-02-15 DIAGNOSIS — Z1152 Encounter for screening for COVID-19: Secondary | ICD-10-CM

## 2021-02-15 DIAGNOSIS — J069 Acute upper respiratory infection, unspecified: Secondary | ICD-10-CM

## 2021-02-15 MED ORDER — AMOXICILLIN-POT CLAVULANATE 875-125 MG PO TABS: 1.0000 | ORAL_TABLET | Freq: Two times a day (BID) | ORAL | 0 refills | Status: AC

## 2021-02-15 NOTE — ED Provider Notes (Signed)
[facility]   [id number] 02/15/21 Arrival Time: 1042   CC: COVID symptoms  SUBJECTIVE: History from: patient.  Kimberly Shaw is a 45 y.o. female who presents with nasal congestion, and sinus pressure x 3-4 days.  Denies sick exposure to COVID, flu or strep. Has tried OTC medications.  Reports previous symptoms in the past with sinus infection.   Denies fever, chills, SOB, wheezing, chest pain, nausea, changes in bowel or bladder habits.     ROS: As per HPI.  All other pertinent ROS negative.     Past Medical History:  Diagnosis Date   GERD (gastroesophageal reflux disease)    Hypothyroidism    Migraines    Thyroid disease    Past Surgical History:  Procedure Laterality Date   BREAST ENHANCEMENT SURGERY     EYE SURGERY     HEMI-MICRODISCECTOMY LUMBAR LAMINECTOMY LEVEL 1 Left 01/01/2016   Procedure: CENTRAL DECOMPRESSION L5,S1 FOR STENOSIS, FORAMINOTOMY S1 NERVE ROOT ON LEFT, MICRODISCECTOMY L5,S1;  Surgeon: [name], MD;  Location: [facility];  Service: Orthopedics;  Laterality: Left;   tummy tuck surgery      Allergies  Allergen Reactions   Bee Venom     Swelling    Mobic [Meloxicam] Other (See Comments)    Burning in throat   No current facility-administered medications on file prior to encounter.   Current Outpatient Medications on File Prior to Encounter  Medication Sig Dispense Refill   levothyroxine (SYNTHROID, LEVOTHROID) 75 MCG tablet Take 75 mcg by mouth daily before breakfast.     Social History   Socioeconomic History   Marital status: Married    Spouse name: Not on file   Number of children: Not on file   Years of education: Not on file   Highest education level: Not on file  Occupational History   Not on file  Tobacco Use   Smoking status: Never   Smokeless tobacco: Never  Substance and Sexual Activity   Alcohol use: Yes    Comment: rare   Drug use: No   Sexual activity: Not on file  Other Topics Concern   Not on file  Social  History Narrative   Not on file   Social Determinants of Health   Financial Resource Strain: Not on file  Food Insecurity: Not on file  Transportation Needs: Not on file  Physical Activity: Not on file  Stress: Not on file  Social Connections: Not on file  Intimate Partner Violence: Not on file   History reviewed. No pertinent family history.  OBJECTIVE:  Vitals:   02/15/21 1050  BP: 108/76  Pulse: 82  Resp: 16  Temp: 98.7 F (37.1 C)  TempSrc: Oral  SpO2: 95%     General appearance: alert; appears mildly fatigued, but nontoxic; speaking in full sentences and tolerating own secretions HEENT: NCAT; Ears: EACs clear, TMs pearly gray; Eyes: PERRL.  EOM grossly intact. Nose: nares patent with clear rhinorrhea, nares patent with erythema, Throat: oropharynx clear, tonsils non erythematous or enlarged, uvula midline  Neck: supple without LAD Lungs: unlabored respirations, symmetrical air entry; cough: mild; no respiratory distress; CTAB Heart: regular rate and rhythm.   Skin: warm and dry Psychological: alert and cooperative; normal mood and affect  ASSESSMENT & PLAN:  1. Encounter for screening for COVID-19   2. Viral URI with cough     Meds ordered this encounter  Medications   amoxicillin-clavulanate (AUGMENTIN) 875-125 MG tablet    Sig: Take 1 tablet by mouth every 12 (  twelve) hours for 10 days.    Dispense:  20 tablet    Refill:  0    Order Specific Question:   Supervising Provider    Answer:   [name] [[id number]]     COVID testing ordered.  It will take between 2-5 days for test results.  Someone will contact you regarding abnormal results.    In the meantime: You should remain isolated in your home for 5 days from symptom onset AND greater than 72 hours after symptoms resolution (absence of fever without the use of fever-reducing medication and improvement in respiratory symptoms), whichever is longer Get plenty of rest and push fluids Augmentin for  possible sinus infection.  Symptoms appear viral at this time, but if symptoms don't improve over the next few days please begin taking antibiotic Use OTC zyrtec for nasal congestion, runny nose, and/or sore throat Use OTC flonase for nasal congestion and runny nose Use medications daily for symptom relief Use OTC medications like ibuprofen or tylenol as needed fever or pain Call or go to the ED if you have any new or worsening symptoms such as fever, worsening cough, shortness of breath, chest tightness, chest pain, turning blue, changes in mental status, etc...   Reviewed expectations re: course of current medical issues. Questions answered. Outlined signs and symptoms indicating need for more acute intervention. Patient verbalized understanding. After Visit Summary given.          [name], PA-C 02/15/21 1116

## 2021-02-15 NOTE — ED Triage Notes (Addendum)
Patient c/o nasal congestion and sinus pressure x 3-4 days.   Patient denies fever.   Patient endorses worsening sinus pressure each day.   Patient endorses scratchy throat.   Patient has taken Zytrec with no relief of symptoms.

## 2021-02-15 NOTE — Discharge Instructions (Signed)
COVID testing ordered.  It will take between 2-5 days for test results.  Someone will contact you regarding abnormal results.    In the meantime: You should remain isolated in your home for 5 days from symptom onset AND greater than 72 hours after symptoms resolution (absence of fever without the use of fever-reducing medication and improvement in respiratory symptoms), whichever is longer Get plenty of rest and push fluids Augmentin for possible sinus infect.  Symptoms appear viral at this time, but if symptoms don't improve over the next few days please begin taking antibiotic Use OTC zyrtec for nasal congestion, runny nose, and/or sore throat Use OTC flonase for nasal congestion and runny nose Use medications daily for symptom relief Use OTC medications like ibuprofen or tylenol as needed fever or pain Call or go to the ED if you have any new or worsening symptoms such as fever, worsening cough, shortness of breath, chest tightness, chest pain, turning blue, changes in mental status, etc..Marland Kitchen

## 2021-02-16 LAB — SARS-COV-2, NAA 2 DAY TAT

## 2021-02-16 LAB — NOVEL CORONAVIRUS, NAA: SARS-CoV-2, NAA: NOT DETECTED

## 2021-02-18 ENCOUNTER — Ambulatory Visit (INDEPENDENT_AMBULATORY_CARE_PROVIDER_SITE_OTHER): Payer: BC Managed Care – PPO | Admitting: Family

## 2021-02-18 ENCOUNTER — Encounter: Payer: Self-pay | Admitting: Family

## 2021-02-18 DIAGNOSIS — J019 Acute sinusitis, unspecified: Secondary | ICD-10-CM

## 2021-02-18 MED ORDER — PROMETHAZINE-DM 6.25-15 MG/5ML PO SYRP: 5.0000 mL | ORAL_SOLUTION | Freq: Three times a day (TID) | ORAL | 0 refills | Status: DC | PRN

## 2021-02-18 MED ORDER — PREDNISONE 10 MG (21) PO TBPK: ORAL_TABLET | ORAL | 0 refills | Status: DC

## 2021-02-18 NOTE — Progress Notes (Signed)
Virtual Visit  Note Due to COVID-19 pandemic this visit was conducted virtually. This visit type was conducted due to national recommendations for restrictions regarding the COVID-19 Pandemic (e.g. social distancing, sheltering in place) in an effort to limit this patient's exposure and mitigate transmission in our community. All issues noted in this document were discussed and addressed.  A physical exam was not performed with this format.  I connected with Kimberly Shaw on 02/18/21 at 12:33 pm  by telephone and verified that I am speaking with the correct person using two identifiers. Kimberly Shaw is currently located at home and no one is currently with her during visit. The provider, Jannifer Rodney, FNP is located in their office at time of visit.  I discussed the limitations, risks, security and privacy concerns of performing an evaluation and management service by telephone and the availability of in person appointments. I also discussed with the patient that there may be a patient responsible charge related to this service. The patient expressed understanding and agreed to proceed.   Kimberly Shaw, Kimberly Shaw are scheduled for a virtual visit with your provider today.    Just as we do with appointments in the office, we must obtain your consent to participate.  Your consent will be active for this visit and any virtual visit you may have with one of our providers in the next 365 days.    If you have a MyChart account, I can also send a copy of this consent to you electronically.  All virtual visits are billed to your insurance company just like a traditional visit in the office.  As this is a virtual visit, video technology does not allow for your provider to perform a traditional examination.  This may limit your provider's ability to fully assess your condition.  If your provider identifies any concerns that need to be evaluated in person or the need to arrange testing such as labs, EKG, etc, we will  make arrangements to do so.    Although advances in technology are sophisticated, we cannot ensure that it will always work on either your end or our end.  If the connection with a video visit is poor, we may have to switch to a telephone visit.  With either a video or telephone visit, we are not always able to ensure that we have a secure connection.   I need to obtain your verbal consent now.   Are you willing to proceed with your visit today?   Kimberly Shaw has provided verbal consent on 02/18/2021 for a virtual visit (video or telephone).   Jannifer Rodney, [place] 02/18/2021  12:36 PM   History and Present Illness:  Pt calls the office today with sinus issues that started 02/12/21 and has worsen. She had a negative COVID. She went to the Urgent Care on 02/15/21 and started on Augmentin.  Sinusitis This is a new problem. The current episode started in the past 7 days. The problem has been gradually worsening since onset. There has been no fever. Her pain is at a severity of 4/10. The pain is mild. Associated symptoms include congestion, coughing, headaches, a hoarse voice, sinus pressure, sneezing and a sore throat. Pertinent negatives include no ear pain or shortness of breath. Past treatments include oral decongestants (augmentin). The treatment provided mild relief.     Review of Systems  HENT:  Positive for congestion, hoarse voice, sinus pressure, sneezing and sore throat. Negative for ear pain.   Respiratory:  Positive for cough.  Negative for shortness of breath.   Neurological:  Positive for headaches.    Observations/Objective: No SOB or distress noted, nasal congestion  Assessment and Plan: 1. Acute sinusitis, recurrence not specified, unspecified location - Take meds as prescribed - Use a cool mist humidifier  -Use saline nose sprays frequently -Force fluids -For any cough or congestion  Use plain Mucinex- regular strength or max strength is fine -For fever or aces or pains-  take tylenol or ibuprofen. -Throat lozenges if help -Continue Augmentin - predniSONE (STERAPRED UNI-PAK 21 TAB) 10 MG (21) TBPK tablet; Use as directed  Dispense: 21 tablet; Refill: 0 - promethazine-dextromethorphan (PROMETHAZINE-DM) 6.25-15 MG/5ML syrup; Take 5 mLs by mouth 3 (three) times daily as needed for cough.  Dispense: 240 mL; Refill: 0     I discussed the assessment and treatment plan with the patient. The patient was provided an opportunity to ask questions and all were answered. The patient agreed with the plan and demonstrated an understanding of the instructions.   The patient was advised to call back or seek an in-person evaluation if the symptoms worsen or if the condition fails to improve as anticipated.  The above assessment and management plan was discussed with the patient. The patient verbalized understanding of and has agreed to the management plan. Patient is aware to call the clinic if symptoms persist or worsen. Patient is aware when to return to the clinic for a follow-up visit. Patient educated on when it is appropriate to go to the emergency department.   Time call ended: 12:45 pm    I provided 12 minutes of  non face-to-face time during this encounter.    Jannifer Rodney, FNP

## 2021-03-26 DIAGNOSIS — U071 COVID-19: Secondary | ICD-10-CM | POA: Diagnosis not present

## 2021-03-27 ENCOUNTER — Telehealth: Payer: BC Managed Care – PPO | Admitting: Family

## 2021-03-27 ENCOUNTER — Encounter: Payer: Self-pay | Admitting: Family

## 2021-03-27 DIAGNOSIS — U071 COVID-19: Secondary | ICD-10-CM | POA: Diagnosis not present

## 2021-03-27 MED ORDER — BENZONATATE 200 MG PO CAPS
200.0000 mg | ORAL_CAPSULE | Freq: Three times a day (TID) | ORAL | 1 refills | Status: DC | PRN
Start: 2021-03-27 — End: 2021-06-16

## 2021-03-27 MED ORDER — PROMETHAZINE-DM 6.25-15 MG/5ML PO SYRP: 5.0000 mL | ORAL_SOLUTION | Freq: Three times a day (TID) | ORAL | 0 refills | Status: DC | PRN

## 2021-03-27 NOTE — Progress Notes (Signed)
Virtual Visit Consent   Kimberly Shaw, you are scheduled for a virtual visit with a [facility] provider today.     Just as with appointments in the office, your consent must be obtained to participate.  Your consent will be active for this visit and any virtual visit you may have with one of our providers in the next 365 days.     If you have a MyChart account, a copy of this consent can be sent to you electronically.  All virtual visits are billed to your insurance company just like a traditional visit in the office.    As this is a virtual visit, video technology does not allow for your provider to perform a traditional examination.  This may limit your provider's ability to fully assess your condition.  If your provider identifies any concerns that need to be evaluated in person or the need to arrange testing (such as labs, EKG, etc.), we will make arrangements to do so.     Although advances in technology are sophisticated, we cannot ensure that it will always work on either your end or our end.  If the connection with a video visit is poor, the visit may have to be switched to a telephone visit.  With either a video or telephone visit, we are not always able to ensure that we have a secure connection.     I need to obtain your verbal consent now.   Are you willing to proceed with your visit today?    Kimberly Shaw has provided verbal consent on 03/27/2021 for a virtual visit (video or telephone).   Jannifer Rodney, FNP   Date: 03/27/2021 12:50 PM   Virtual Visit via Video Note   I, Jannifer Rodney, connected with  Kimberly Shaw  ([id number], 1975-10-18) on 03/27/21 at  3:55 PM EDT by a video-enabled telemedicine application and verified that I am speaking with the correct person using two identifiers.  Location: Patient: Virtual Visit Location Patient: Home Provider: Virtual Visit Location Provider: Office/Clinic   I discussed the limitations of evaluation and management by telemedicine and  the availability of in person appointments. The patient expressed understanding and agreed to proceed.    History of Present Illness: Kimberly Shaw is a 45 y.o. who identifies as a female who was assigned female at birth, and is being seen today for COVID. She reports her symptoms started Monday evening and tested positive on Wednesday. She did an Evisit yesterday and was given antiviral.   HPI: Cough This is a new problem. The current episode started in the past 7 days. The problem has been waxing and waning. The cough is Non-productive. Associated symptoms include chills (resolved), a fever (resolved), myalgias, nasal congestion and postnasal drip. Pertinent negatives include no ear congestion, ear pain, headaches, sore throat, shortness of breath or wheezing. The symptoms are aggravated by lying down. She has tried rest and OTC inhaler for the symptoms. The treatment provided mild relief.   Problems:  Patient Active Problem List   Diagnosis Date Noted   GERD (gastroesophageal reflux disease) 04/19/2018   Spinal stenosis, lumbar region, with neurogenic claudication 01/01/2016   Left sciatic nerve pain 05/22/2015   Hypothyroidism 05/22/2015    Allergies:  Allergies  Allergen Reactions   Bee Venom     Swelling    Mobic [Meloxicam] Other (See Comments)    Burning in throat   Medications:  Current Outpatient Medications:    benzonatate (TESSALON) 200 MG capsule, Take 1 capsule (200  mg total) by mouth 3 (three) times daily as needed., Disp: 30 capsule, Rfl: 1   promethazine-dextromethorphan (PROMETHAZINE-DM) 6.25-15 MG/5ML syrup, Take 5 mLs by mouth 3 (three) times daily as needed for cough., Disp: 473 mL, Rfl: 0   levothyroxine (SYNTHROID, LEVOTHROID) 75 MCG tablet, Take 75 mcg by mouth daily before breakfast., Disp: , Rfl:   Observations/Objective: Patient is well-developed, well-nourished in no acute distress.  Resting comfortably  at home.  Head is normocephalic, atraumatic.  No  labored breathing.  Speech is clear and coherent with logical content.  Patient is alert and oriented at baseline.  Nasal congestion   Assessment and Plan: 1. COVID-19 - benzonatate (TESSALON) 200 MG capsule; Take 1 capsule (200 mg total) by mouth 3 (three) times daily as needed.  Dispense: 30 capsule; Refill: 1 - promethazine-dextromethorphan (PROMETHAZINE-DM) 6.25-15 MG/5ML syrup; Take 5 mLs by mouth 3 (three) times daily as needed for cough.  Dispense: 473 mL; Refill: 0 Discussed symptoms and antivirals. Given current symptoms, she does not have to start antivirals. She is nervous about side effects and wants to hold off at this time.  COVID positive, rest, force fluids, tylenol as needed, Quarantine for at least 5 days and you are fever free, then must wear a mask out in public from day 6-10, report any worsening symptoms such as increased shortness of breath, swelling, or continued high fevers. Possible adverse effects discussed with antivirals.    Follow Up Instructions: I discussed the assessment and treatment plan with the patient. The patient was provided an opportunity to ask questions and all were answered. The patient agreed with the plan and demonstrated an understanding of the instructions.  A copy of instructions were sent to the patient via MyChart unless otherwise noted below.     The patient was advised to call back or seek an in-person evaluation if the symptoms worsen or if the condition fails to improve as anticipated.  Time:  I spent 12 minutes with the patient via telehealth technology discussing the above problems/concerns.    Jannifer Rodney, FNP

## 2021-03-27 NOTE — Patient Instructions (Signed)
10 Things You Can Do to Manage Your COVID-19 Symptoms at Home If you have possible or confirmed COVID-19 Stay home except to get medical care. Monitor your symptoms carefully. If your symptoms get worse, call your healthcare provider immediately. Get rest and stay hydrated. If you have a medical appointment, call the healthcare provider ahead of time and tell them that you have or may have COVID-19. For medical emergencies, call 911 and notify the dispatch personnel that you have or may have COVID-19. Cover your cough and sneezes with a tissue or use the inside of your elbow. Wash your hands often with soap and water for at least 20 seconds or clean your hands with an alcohol-based hand sanitizer that contains at least 60% alcohol. As much as possible, stay in a specific room and away from other people in your home. Also, you should use a separate bathroom, if available. If you need to be around other people in or outside of the home, wear a mask. Avoid sharing personal items with other people in your household, like dishes, towels, and bedding. Clean all surfaces that are touched often, like counters, tabletops, and doorknobs. Use household cleaning sprays or wipes according to the label instructions. cdc.gov/coronavirus 12/08/2019 This information is not intended to replace advice given to you by your health care provider. Make sure you discuss any questions you have with your health care provider. Document Revised: 09/26/2020 Document Reviewed: 09/26/2020 Elsevier Patient Education  2022 Elsevier Inc.  

## 2021-06-16 ENCOUNTER — Encounter: Payer: Self-pay | Admitting: Family

## 2021-06-16 ENCOUNTER — Encounter: Payer: Self-pay | Admitting: Internal Medicine

## 2021-06-16 ENCOUNTER — Telehealth: Payer: Self-pay | Admitting: Family

## 2021-06-16 ENCOUNTER — Ambulatory Visit: Payer: BC Managed Care – PPO | Admitting: Family

## 2021-06-16 VITALS — BP 119/73 | HR 76 | Temp 97.5°F | Ht 65.0 in | Wt 174.0 lb

## 2021-06-16 DIAGNOSIS — E039 Hypothyroidism, unspecified: Secondary | ICD-10-CM

## 2021-06-16 DIAGNOSIS — R1310 Dysphagia, unspecified: Secondary | ICD-10-CM

## 2021-06-16 DIAGNOSIS — Z0000 Encounter for general adult medical examination without abnormal findings: Secondary | ICD-10-CM | POA: Diagnosis not present

## 2021-06-16 DIAGNOSIS — K219 Gastro-esophageal reflux disease without esophagitis: Secondary | ICD-10-CM | POA: Diagnosis not present

## 2021-06-16 DIAGNOSIS — Z0001 Encounter for general adult medical examination with abnormal findings: Secondary | ICD-10-CM | POA: Diagnosis not present

## 2021-06-16 MED ORDER — OMEPRAZOLE 20 MG PO CPDR: 20.0000 mg | DELAYED_RELEASE_CAPSULE | Freq: Every day | ORAL | 4 refills | Status: DC

## 2021-06-16 NOTE — Progress Notes (Signed)
Subjective:    Patient ID: Kimberly Shaw, female    DOB: July 20, 1975, 46 y.o.   MRN: [id number]  Chief Complaint  Patient presents with   Gastroesophageal Reflux    Wants GI referral    PT presents to the office today with CPE and complaints of dysphagia and epigastric pain that started over a year ago. However, it seems like it is more frequent.   She is followed by GYN and they monitor her thyroid. States this is stable.  Gastroesophageal Reflux She complains of choking, dysphagia and early satiety. She reports no belching, no globus sensation, no nausea, no stridor or no wheezing. The current episode started more than 1 year ago. The problem occurs occasionally. The symptoms are aggravated by certain foods.     Review of Systems  Respiratory:  Positive for choking. Negative for wheezing.   Gastrointestinal:  Positive for dysphagia. Negative for nausea.  All other systems reviewed and are negative.  History reviewed. No pertinent family history. Social History   Socioeconomic History   Marital status: Married    Spouse name: Not on file   Number of children: Not on file   Years of education: Not on file   Highest education level: Not on file  Occupational History   Not on file  Tobacco Use   Smoking status: Never   Smokeless tobacco: Never  Substance and Sexual Activity   Alcohol use: Yes    Comment: rare   Drug use: No   Sexual activity: Not on file  Other Topics Concern   Not on file  Social History Narrative   Not on file   Social Determinants of Health   Financial Resource Strain: Not on file  Food Insecurity: Not on file  Transportation Needs: Not on file  Physical Activity: Not on file  Stress: Not on file  Social Connections: Not on file       Objective:   Physical Exam Vitals reviewed.  Constitutional:      General: She is not in acute distress.    Appearance: She is well-developed.  HENT:     Head: Normocephalic and atraumatic.     Right  Ear: Tympanic membrane normal.     Left Ear: Tympanic membrane normal.  Eyes:     Pupils: Pupils are equal, round, and reactive to light.  Neck:     Thyroid: No thyromegaly.  Cardiovascular:     Rate and Rhythm: Normal rate and regular rhythm.     Heart sounds: Normal heart sounds. No murmur heard. Pulmonary:     Effort: Pulmonary effort is normal. No respiratory distress.     Breath sounds: Normal breath sounds. No wheezing.  Abdominal:     General: Bowel sounds are normal. There is no distension.     Palpations: Abdomen is soft.     Tenderness: There is no abdominal tenderness.  Musculoskeletal:        General: No tenderness. Normal range of motion.     Cervical back: Normal range of motion and neck supple.  Skin:    General: Skin is warm and dry.  Neurological:     Mental Status: She is alert and oriented to person, place, and time.     Cranial Nerves: No cranial nerve deficit.     Deep Tendon Reflexes: Reflexes are normal and symmetric.  Psychiatric:        Behavior: Behavior normal.        Thought Content: Thought content normal.  Judgment: Judgment normal.    BP 119/73    Pulse 76    Temp (!) 97.5 F (36.4 C) (Temporal)    Ht 5' 5"  (1.651 m)    Wt 174 lb (78.9 kg)    BMI 28.96 kg/m        Assessment & Plan:  Kimberly Shaw comes in today with chief complaint of Gastroesophageal Reflux (Wants GI referral/)   Diagnosis and orders addressed:  1. Gastroesophageal reflux disease, unspecified whether esophagitis present Will start Prilosec 20 mg  -Diet discussed- Avoid fried, spicy, citrus foods, caffeine and alcohol -Do not eat 2-3 hours before bedtime -Encouraged small frequent meals -Avoid NSAID's -RTO if symptoms worsen or do not improve  - Ambulatory referral to Gastroenterology - omeprazole (PRILOSEC) 20 MG capsule; Take 1 capsule (20 mg total) by mouth daily.  Dispense: 90 capsule; Refill: 4 - CMP14+EGFR - CBC with Differential/Platelet  2.  Dysphagia, unspecified type - Ambulatory referral to Gastroenterology - omeprazole (PRILOSEC) 20 MG capsule; Take 1 capsule (20 mg total) by mouth daily.  Dispense: 90 capsule; Refill: 4 - CMP14+EGFR - CBC with Differential/Platelet  3. Annual physical exam - CMP14+EGFR - CBC with Differential/Platelet - Lipid panel - Hepatitis C antibody  4. Hypothyroidism, unspecified type - CMP14+EGFR - CBC with Differential/Platelet   Labs pending Health Maintenance reviewed Diet and exercise encouraged  Follow up plan: 1 year   Evelina Dun, FNP

## 2021-06-16 NOTE — Patient Instructions (Signed)
Gastroesophageal Reflux Disease, Adult Gastroesophageal reflux (GER) happens when acid from the stomach flows up into the tube that connects the mouth and the stomach (esophagus). Normally, food travels down the esophagus and stays in the stomach to be digested. With GER, food and stomach acid sometimes move back up into the esophagus. You may have a disease called gastroesophageal reflux disease (GERD) if the reflux: Happens often. Causes frequent or very bad symptoms. Causes problems such as damage to the esophagus. When this happens, the esophagus becomes sore and swollen. Over time, GERD can make small holes (ulcers) in the lining of the esophagus. What are the causes? This condition is caused by a problem with the muscle between the esophagus and the stomach. When this muscle is weak or not normal, it does not close properly to keep food and acid from coming back up from the stomach. The muscle can be weak because of: Tobacco use. Pregnancy. Having a certain type of hernia (hiatal hernia). Alcohol use. Certain foods and drinks, such as coffee, chocolate, onions, and peppermint. What increases the risk? Being overweight. Having a disease that affects your connective tissue. Taking NSAIDs, such a ibuprofen. What are the signs or symptoms? Heartburn. Difficult or painful swallowing. The feeling of having a lump in the throat. A bitter taste in the mouth. Bad breath. Having a lot of saliva. Having an upset or bloated stomach. Burping. Chest pain. Different conditions can cause chest pain. Make sure you see your doctor if you have chest pain. Shortness of breath or wheezing. A long-term cough or a cough at night. Wearing away of the surface of teeth (tooth enamel). Weight loss. How is this treated? Making changes to your diet. Taking medicine. Having surgery. Treatment will depend on how bad your symptoms are. Follow these instructions at home: Eating and drinking  Follow a  diet as told by your doctor. You may need to avoid foods and drinks such as: Coffee and tea, with or without caffeine. Drinks that contain alcohol. Energy drinks and sports drinks. Bubbly (carbonated) drinks or sodas. Chocolate and cocoa. Peppermint and mint flavorings. Garlic and onions. Horseradish. Spicy and acidic foods. These include peppers, chili powder, curry powder, vinegar, hot sauces, and BBQ sauce. Citrus fruit juices and citrus fruits, such as oranges, lemons, and limes. Tomato-based foods. These include red sauce, chili, salsa, and pizza with red sauce. Fried and fatty foods. These include donuts, french fries, potato chips, and high-fat dressings. High-fat meats. These include hot dogs, rib eye steak, sausage, ham, and bacon. High-fat dairy items, such as whole milk, butter, and cream cheese. Eat small meals often. Avoid eating large meals. Avoid drinking large amounts of liquid with your meals. Avoid eating meals during the 2-3 hours before bedtime. Avoid lying down right after you eat. Do not exercise right after you eat. Lifestyle  Do not smoke or use any products that contain nicotine or tobacco. If you need help quitting, ask your doctor. Try to lower your stress. If you need help doing this, ask your doctor. If you are overweight, lose an amount of weight that is healthy for you. Ask your doctor about a safe weight loss goal. General instructions Pay attention to any changes in your symptoms. Take over-the-counter and prescription medicines only as told by your doctor. Do not take aspirin, ibuprofen, or other NSAIDs unless your doctor says it is okay. Wear loose clothes. Do not wear anything tight around your waist. Raise (elevate) the head of your bed about 6   inches (15 cm). You may need to use a wedge to do this. Avoid bending over if this makes your symptoms worse. Keep all follow-up visits. Contact a doctor if: You have new symptoms. You lose weight and you  do not know why. You have trouble swallowing or it hurts to swallow. You have wheezing or a cough that keeps happening. You have a hoarse voice. Your symptoms do not get better with treatment. Get help right away if: You have sudden pain in your arms, neck, jaw, teeth, or back. You suddenly feel sweaty, dizzy, or light-headed. You have chest pain or shortness of breath. You vomit and the vomit is green, yellow, or black, or it looks like blood or coffee grounds. You faint. Your poop (stool) is red, bloody, or black. You cannot swallow, drink, or eat. These symptoms may represent a serious problem that is an emergency. Do not wait to see if the symptoms will go away. Get medical help right away. Call your local emergency services (911 in the [place] Do not drive yourself to the hospital. Summary If a person has gastroesophageal reflux disease (GERD), food and stomach acid move back up into the esophagus and cause symptoms or problems such as damage to the esophagus. Treatment will depend on how bad your symptoms are. Follow a diet as told by your doctor. Take all medicines only as told by your doctor. This information is not intended to replace advice given to you by your health care provider. Make sure you discuss any questions you have with your health care provider. Document Revised: 11/20/2019 Document Reviewed: 11/20/2019 Elsevier Patient Education  2022 Elsevier Inc.  

## 2021-06-16 NOTE — Telephone Encounter (Signed)
Pt has an apt with [facility] in May. She did not like the reviews for them. She wants her referral switched to [facility] in [place].

## 2021-06-17 LAB — CBC WITH DIFFERENTIAL/PLATELET
Basophils Absolute: 0.1 10*3/uL (ref 0.0–0.2)
Basos: 1 %
EOS (ABSOLUTE): 0.3 10*3/uL (ref 0.0–0.4)
Eos: 4 %
Hematocrit: 42.4 % (ref 34.0–46.6)
Hemoglobin: 14.3 g/dL (ref 11.1–15.9)
Immature Grans (Abs): 0 10*3/uL (ref 0.0–0.1)
Immature Granulocytes: 0 %
Lymphocytes Absolute: 2.9 10*3/uL (ref 0.7–3.1)
Lymphs: 33 %
MCH: 30.1 pg (ref 26.6–33.0)
MCHC: 33.7 g/dL (ref 31.5–35.7)
MCV: 89 fL (ref 79–97)
Monocytes Absolute: 0.6 10*3/uL (ref 0.1–0.9)
Monocytes: 6 %
Neutrophils Absolute: 4.8 10*3/uL (ref 1.4–7.0)
Neutrophils: 56 %
Platelets: 248 10*3/uL (ref 150–450)
RBC: 4.75 x10E6/uL (ref 3.77–5.28)
RDW: 12.3 % (ref 11.7–15.4)
WBC: 8.6 10*3/uL (ref 3.4–10.8)

## 2021-06-17 LAB — CMP14+EGFR
ALT: 12 IU/L (ref 0–32)
AST: 10 IU/L (ref 0–40)
Albumin/Globulin Ratio: 2 (ref 1.2–2.2)
Albumin: 4.5 g/dL (ref 3.8–4.8)
Alkaline Phosphatase: 91 IU/L (ref 44–121)
BUN/Creatinine Ratio: 11 (ref 9–23)
BUN: 8 mg/dL (ref 6–24)
Bilirubin Total: 0.3 mg/dL (ref 0.0–1.2)
CO2: 23 mmol/L (ref 20–29)
Calcium: 9.4 mg/dL (ref 8.7–10.2)
Chloride: 104 mmol/L (ref 96–106)
Creatinine, Ser: 0.76 mg/dL (ref 0.57–1.00)
Globulin, Total: 2.3 g/dL (ref 1.5–4.5)
Glucose: 93 mg/dL (ref 70–99)
Potassium: 4.4 mmol/L (ref 3.5–5.2)
Sodium: 140 mmol/L (ref 134–144)
Total Protein: 6.8 g/dL (ref 6.0–8.5)
eGFR: 97 mL/min/{1.73_m2} (ref >59)

## 2021-06-17 LAB — LIPID PANEL
Chol/HDL Ratio: 3.5 ratio (ref 0.0–4.4)
Cholesterol, Total: 191 mg/dL (ref 100–199)
HDL: 55 mg/dL (ref >39)
LDL Chol Calc (NIH): 112 mg/dL — ABNORMAL HIGH (ref 0–99)
Triglycerides: 135 mg/dL (ref 0–149)
VLDL Cholesterol Cal: 24 mg/dL (ref 5–40)

## 2021-06-17 LAB — HEPATITIS C ANTIBODY: Hep C Virus Ab: <0.1 s/co ratio (ref 0.0–0.9)

## 2021-06-30 ENCOUNTER — Other Ambulatory Visit: Payer: Self-pay | Admitting: Family

## 2021-06-30 MED ORDER — OMEPRAZOLE 40 MG PO CPDR: 40.0000 mg | DELAYED_RELEASE_CAPSULE | Freq: Every day | ORAL | 1 refills | Status: DC

## 2021-08-12 ENCOUNTER — Ambulatory Visit: Payer: BC Managed Care – PPO | Admitting: Family

## 2021-08-12 ENCOUNTER — Encounter: Payer: Self-pay | Admitting: Family

## 2021-08-12 VITALS — BP 102/66 | HR 74 | Temp 97.5°F | Ht 65.0 in | Wt 177.4 lb

## 2021-08-12 DIAGNOSIS — M5412 Radiculopathy, cervical region: Secondary | ICD-10-CM

## 2021-08-12 DIAGNOSIS — Z23 Encounter for immunization: Secondary | ICD-10-CM

## 2021-08-12 DIAGNOSIS — R42 Dizziness and giddiness: Secondary | ICD-10-CM

## 2021-08-12 MED ORDER — DICLOFENAC SODIUM 75 MG PO TBEC: 75.0000 mg | DELAYED_RELEASE_TABLET | Freq: Two times a day (BID) | ORAL | 0 refills | Status: DC

## 2021-08-12 MED ORDER — BACLOFEN 10 MG PO TABS: 10.0000 mg | ORAL_TABLET | Freq: Three times a day (TID) | ORAL | 0 refills | Status: DC

## 2021-08-12 MED ORDER — MECLIZINE HCL 50 MG PO TABS: 50.0000 mg | ORAL_TABLET | Freq: Three times a day (TID) | ORAL | 1 refills | Status: DC | PRN

## 2021-08-12 NOTE — Patient Instructions (Signed)
Cervical Radiculopathy ?Cervical radiculopathy happens when a nerve in the neck (a cervical nerve) is pinched or bruised. This condition can happen because of an injury to the cervical spine (vertebrae) in the neck, or as part of the normal aging process. Pressure on the cervical nerves can cause pain or numbness that travels from the neck all the way down to the arm and fingers. This condition usually gets better with rest. Treatment may be needed if the condition does not improve. ?What are the causes? ?This condition may be caused by: ?A neck injury. ?A bulging (herniated) disk. ?Muscle spasms. ?Muscle tightness in the neck due to overuse. ?Arthritis. ?Breakdown or degeneration in the bones and joints of the spine (spondylosis) due to aging. ?Bone spurs that may develop near the cervical nerves. ?What are the signs or symptoms? ?Symptoms of this condition include: ?Pain. The pain may travel from the neck to the arm and hand. The pain can be severe or irritating. It may get worse when you move your neck. ?Numbness or tingling in your arm or hand. ?Weakness in the affected arm and hand, in severe cases. ?How is this diagnosed? ?This condition may be diagnosed based on your symptoms, your medical history, and a physical exam. You may also have tests, including: ?X-rays. ?CT scan. ?MRI. ?Electromyogram (EMG). ?Nerve conduction tests. ?How is this treated? ?In many cases, treatment is not needed for this condition. With rest, the condition usually gets better over time. If treatment is needed, options may include: ?Wearing a soft neck collar (cervical collar) for short periods of time. ?Doing physical therapy to strengthen your neck muscles. ?Taking medicines. These may include NSAIDs, such as ibuprofen, or oral corticosteroids. ?Having spinal injections, in severe cases. ?Having surgery. This may be needed if other treatments do not help. Different types of surgery may be done depending on the cause of this  condition. ?Follow these instructions at home: ?If you have a cervical collar: ?Wear it as told by your health care provider. Remove it only as told by your health care provider. ?Ask your health care provider if you can remove the cervical collar for cleaning and bathing. If you are allowed to remove the collar for cleaning or bathing: ?Follow instructions from your health care provider about how to remove the collar safely. ?Clean the collar by wiping it with mild soap and water and drying it completely. ?Take out any removable pads in the collar every 1-2 days, and wash them by hand with soap and water. Let them air-dry completely before you put them back in the collar. ?Check your skin under the collar for irritation or sores. If you see any, tell your health care provider. ?Managing pain ?  ?Take over-the-counter and prescription medicines only as told by your health care provider. ?If directed, put ice on the affected area. To do this: ?If you have a soft neck collar, remove it as told by your health care provider. ?Put ice in a plastic bag. ?Place a towel between your skin and the bag. ?Leave the ice on for 20 minutes, 2-3 times a day. ?Remove the ice if your skin turns bright red. This is very important. If you cannot feel pain, heat, or cold, you have a greater risk of damage to the area. ?If applying ice does not help, you can try using heat. Use the heat source that your health care provider recommends, such as a moist heat pack or a heating pad. ?Place a towel between your skin and  the heat source. ?Leave the heat on for 20-30 minutes. ?Remove the heat if your skin turns bright red. This is especially important if you are unable to feel pain, heat, or cold. You have a greater risk of getting burned. ?Try a gentle neck and shoulder massage to help relieve symptoms. ?Activity ?Rest as needed. ?Return to your normal activities as told by your health care provider. Ask your health care provider what  activities are safe for you. ?Do stretching and strengthening exercises as told by your health care provider or your physical therapist. ?You may have to avoid lifting. Ask your health care provider how much you can safely lift. ?General instructions ?Use a flat pillow when you sleep. ?Do not drive while wearing a cervical collar. If you do not have a cervical collar, ask your health care provider if it is safe to drive while your neck heals. ?Ask your health care provider if the medicine prescribed to you requires you to avoid driving or using machinery. ?Do not use any products that contain nicotine or tobacco. These products include cigarettes, chewing tobacco, and vaping devices, such as e-cigarettes. If you need help quitting, ask your health care provider. ?Keep all follow-up visits. This is important. ?Contact a health care provider if: ?Your condition does not improve with treatment. ?Get help right away if: ?Your pain gets much worse and is not controlled with medicines. ?You have weakness or numbness in your hand, arm, face, or leg. ?You have a high fever. ?You have a stiff, rigid neck. ?You lose control of your bowels or your bladder (have incontinence). ?You have trouble with walking, balance, or speaking. ?Summary ?Cervical radiculopathy happens when a nerve in the neck is pinched or bruised. ?A nerve can get pinched from a bulging disk, arthritis, muscle spasms, or an injury to the neck. ?Symptoms include pain, tingling, or numbness radiating from the neck to the arm or hand. Weakness can also occur in severe cases. ?Treatment may include rest, wearing a cervical collar, and physical therapy. Medicines may be prescribed to help with pain. In severe cases, injections or surgery may be needed. ?This information is not intended to replace advice given to you by your health care provider. Make sure you discuss any questions you have with your health care provider. ? ?Vertigo ?Vertigo is the feeling that  you or your surroundings are moving when they are not. This feeling can come and go at any time. Vertigo often goes away on its own. Vertigo can be dangerous if it occurs while you are doing something that could endanger yourself or others, such as driving or operating machinery. ?Your health care provider will do tests to try to determine the cause of your vertigo. Tests will also help your health care provider decide how best to treat your condition. ?Follow these instructions at home: ?Eating and drinking ?  ?Dehydration can make vertigo worse. Drink enough fluid to keep your urine pale yellow. ?Do not drink alcohol. ?Activity ?Return to your normal activities as told by your health care provider. Ask your health care provider what activities are safe for you. ?In the morning, first sit up on the side of the bed. When you feel okay, stand slowly while you hold onto something until you know that your balance is fine. ?Move slowly. Avoid sudden body or head movements or certain positions, as told by your health care provider. ?If you have trouble walking or keeping your balance, try using a cane for stability. If you  feel dizzy or unstable, sit down right away. ?Avoid doing any tasks that would cause danger to you or others if vertigo occurs. ?Avoid bending down if you feel dizzy. Place items in your home so that they are easy for you to reach without bending or leaning over. ?Do not drive or use machinery if you feel dizzy. ?General instructions ?Take over-the-counter and prescription medicines only as told by your health care provider. ?Keep all follow-up visits. This is important. ?Contact a health care provider if: ?Your medicines do not relieve your vertigo or they make it worse. ?Your condition gets worse or you develop new symptoms. ?You have a fever. ?You develop nausea or vomiting, or if nausea gets worse. ?Your family or friends notice any behavioral changes. ?You have numbness or a prickling and tingling  sensation in part of your body. ?Get help right away if you: ?Are always dizzy or you faint. ?Develop severe headaches. ?Develop a stiff neck. ?Develop sensitivity to light. ?Have difficulty moving or speaking. ?Have w

## 2021-08-12 NOTE — Progress Notes (Signed)
? ?Subjective:  ? ? Patient ID: Kimberly Shaw, female    DOB: 1975-11-24, 46 y.o.   MRN: [id number] ? ?Chief Complaint  ?Patient presents with  ? Medical Management of Chronic Issues  ? Dizziness  ?  Started Sunday.  ? Neck Pain  ?  2 weeks and numbness in arm and pinky on left side   ? ?PT presents to the office today for dizziness and neck pain.  ?Dizziness ?This is a new problem. The current episode started in the past 7 days. The problem occurs intermittently. The problem has been waxing and waning. Associated symptoms include neck pain. Pertinent negatives include no chills or congestion. The symptoms are aggravated by standing and bending. She has tried rest and sleep for the symptoms. The treatment provided mild relief.  ?Neck Pain  ?This is a new problem. The current episode started 1 to 4 weeks ago. The problem occurs intermittently. The problem has been gradually improving. The pain is associated with nothing. The pain is present in the right side. The quality of the pain is described as aching. The pain is at a severity of 2/10. The pain is mild. Associated symptoms include tingling (in hand). She has tried bed rest and NSAIDs for the symptoms. The treatment provided mild relief.  ? ? ? ?Review of Systems  ?Constitutional:  Negative for chills.  ?HENT:  Negative for congestion.   ?Musculoskeletal:  Positive for neck pain.  ?Neurological:  Positive for dizziness and tingling (in hand).  ? ?   ?Objective:  ? Physical Exam ?Vitals reviewed.  ?Constitutional:   ?   General: She is not in acute distress. ?   Appearance: She is well-developed.  ?HENT:  ?   Head: Normocephalic and atraumatic.  ?   Right Ear: Tympanic membrane normal.  ?   Left Ear: Tympanic membrane normal.  ?Eyes:  ?   Pupils: Pupils are equal, round, and reactive to light.  ?Neck:  ?   Thyroid: No thyromegaly.  ?Cardiovascular:  ?   Rate and Rhythm: Normal rate and regular rhythm.  ?   Heart sounds: Normal heart sounds. No murmur  heard. ?Pulmonary:  ?   Effort: Pulmonary effort is normal. No respiratory distress.  ?   Breath sounds: Normal breath sounds. No wheezing.  ?Abdominal:  ?   General: Bowel sounds are normal. There is no distension.  ?   Palpations: Abdomen is soft.  ?   Tenderness: There is no abdominal tenderness.  ?Musculoskeletal:     ?   General: No tenderness. Normal range of motion.  ?   Cervical back: Normal range of motion and neck supple.  ?Skin: ?   General: Skin is warm and dry.  ?Neurological:  ?   Mental Status: She is alert and oriented to person, place, and time.  ?   Cranial Nerves: No cranial nerve deficit.  ?   Deep Tendon Reflexes: Reflexes are normal and symmetric.  ?Psychiatric:     ?   Behavior: Behavior normal.     ?   Thought Content: Thought content normal.     ?   Judgment: Judgment normal.  ? ? ? ? ?BP 102/66   Pulse 74   Temp (!) 97.5 ?F (36.4 ?C) (Temporal)   Ht 5\' 5"  (1.651 m)   Wt 177 lb 6.4 oz (80.5 kg)   BMI 29.52 kg/m?  ? ?   ?Assessment & Plan:  ?Kimberly Shaw comes in today with chief complaint of Medical  Management of Chronic Issues, Dizziness (Started Sunday.), and Neck Pain (2 weeks and numbness in arm and pinky on left side ) ? ? ?Diagnosis and orders addressed: ? ?1. Vertigo ?Start antivert  ?Fall precautions  ?Epley exercises discussed- handout given  ?- meclizine (ANTIVERT) 50 MG tablet; Take 1 tablet (50 mg total) by mouth 3 (three) times daily as needed.  Dispense: 60 tablet; Refill: 1 ? ?2. Cervical radiculopathy ?Start diclofenac BID, no other NSAID's ?Sedation precautions discussed  ?ROM exercises  ?- baclofen (LIORESAL) 10 MG tablet; Take 1 tablet (10 mg total) by mouth 3 (three) times daily.  Dispense: 30 each; Refill: 0 ?- diclofenac (VOLTAREN) 75 MG EC tablet; Take 1 tablet (75 mg total) by mouth 2 (two) times daily.  Dispense: 30 tablet; Refill: 0 ? ? ?Health Maintenance reviewed ?Diet and exercise encouraged ? ?Follow up plan: ?Follow up if symptoms worsen or do not improve   ? ? ?Jannifer Rodney, FNP ? ? ? ?

## 2021-10-10 ENCOUNTER — Ambulatory Visit: Payer: BC Managed Care – PPO | Admitting: Gastroenterology

## 2021-10-23 DIAGNOSIS — K222 Esophageal obstruction: Secondary | ICD-10-CM | POA: Diagnosis not present

## 2021-10-23 LAB — HM COLONOSCOPY

## 2021-10-29 ENCOUNTER — Other Ambulatory Visit: Payer: Self-pay | Admitting: *Deleted

## 2021-10-29 MED ORDER — OMEPRAZOLE 40 MG PO CPDR: 40.0000 mg | DELAYED_RELEASE_CAPSULE | Freq: Every day | ORAL | 1 refills | Status: DC

## 2021-10-30 MED ORDER — OMEPRAZOLE 40 MG PO CPDR: 40.0000 mg | DELAYED_RELEASE_CAPSULE | Freq: Every day | ORAL | 1 refills | Status: DC

## 2021-10-30 NOTE — Addendum Note (Signed)
Addended by: Jannifer Rodney A on: 10/30/2021 07:59 AM   Modules accepted: Orders

## 2021-10-30 NOTE — Telephone Encounter (Signed)
Prescription sent to pharmacy.

## 2021-11-06 DIAGNOSIS — Z131 Encounter for screening for diabetes mellitus: Secondary | ICD-10-CM | POA: Diagnosis not present

## 2021-11-06 DIAGNOSIS — Z01419 Encounter for gynecological examination (general) (routine) without abnormal findings: Secondary | ICD-10-CM | POA: Diagnosis not present

## 2021-11-06 DIAGNOSIS — E039 Hypothyroidism, unspecified: Secondary | ICD-10-CM | POA: Diagnosis not present

## 2021-11-06 DIAGNOSIS — Z13220 Encounter for screening for lipoid disorders: Secondary | ICD-10-CM | POA: Diagnosis not present

## 2022-01-13 ENCOUNTER — Ambulatory Visit: Payer: BC Managed Care – PPO | Admitting: Family

## 2022-01-16 ENCOUNTER — Encounter: Payer: Self-pay | Admitting: Family

## 2022-01-16 ENCOUNTER — Ambulatory Visit: Payer: BC Managed Care – PPO | Admitting: Family

## 2022-01-16 VITALS — BP 107/69 | HR 72 | Temp 97.4°F | Resp 20 | Ht 65.0 in | Wt 179.0 lb

## 2022-01-16 DIAGNOSIS — F411 Generalized anxiety disorder: Secondary | ICD-10-CM

## 2022-01-16 DIAGNOSIS — E039 Hypothyroidism, unspecified: Secondary | ICD-10-CM

## 2022-01-16 DIAGNOSIS — E663 Overweight: Secondary | ICD-10-CM

## 2022-01-16 DIAGNOSIS — R4586 Emotional lability: Secondary | ICD-10-CM

## 2022-01-16 MED ORDER — ESCITALOPRAM OXALATE 10 MG PO TABS: 10.0000 mg | ORAL_TABLET | Freq: Every day | ORAL | 1 refills | Status: DC

## 2022-01-16 NOTE — Progress Notes (Signed)
Subjective:    Patient ID: Kimberly Shaw, female    DOB: Jan 29, 1976, 46 y.o.   MRN: [id number]  Chief Complaint  Patient presents with   Mood swings   PT presents to the office today with complaints of mood swings. She did have an abnormal TSH of 5.4. No changes in medications.  Requesting to have hormones checked.  Anxiety Presents for follow-up visit. Symptoms include depressed mood, excessive worry, irritability and restlessness. Patient reports no suicidal ideas. Symptoms occur most days. The severity of symptoms is moderate.    Depression        This is a chronic problem.  The current episode started more than 1 year ago.   Associated symptoms include fatigue, irritable, restlessness, decreased interest and sad.  Associated symptoms include no helplessness, no hopelessness and no suicidal ideas.  Past treatments include nothing.  Past medical history includes anxiety.       Review of Systems  Constitutional:  Positive for fatigue and irritability.  Psychiatric/Behavioral:  Positive for depression. Negative for suicidal ideas.   All other systems reviewed and are negative.      Objective:   Physical Exam Vitals reviewed.  Constitutional:      General: She is irritable. She is not in acute distress.    Appearance: She is well-developed.  HENT:     Head: Normocephalic and atraumatic.     Right Ear: Tympanic membrane normal.     Left Ear: Tympanic membrane normal.  Eyes:     Pupils: Pupils are equal, round, and reactive to light.  Neck:     Thyroid: No thyromegaly.  Cardiovascular:     Rate and Rhythm: Normal rate and regular rhythm.     Heart sounds: Normal heart sounds. No murmur heard. Pulmonary:     Effort: Pulmonary effort is normal. No respiratory distress.     Breath sounds: Normal breath sounds. No wheezing.  Abdominal:     General: Bowel sounds are normal. There is no distension.     Palpations: Abdomen is soft.     Tenderness: There is no abdominal  tenderness.  Musculoskeletal:        General: No tenderness. Normal range of motion.     Cervical back: Normal range of motion and neck supple.  Skin:    General: Skin is warm and dry.  Neurological:     Mental Status: She is alert and oriented to person, place, and time.     Cranial Nerves: No cranial nerve deficit.     Deep Tendon Reflexes: Reflexes are normal and symmetric.  Psychiatric:        Behavior: Behavior normal.        Thought Content: Thought content normal.        Judgment: Judgment normal.       BP 107/69   Pulse 72   Temp (!) 97.4 F (36.3 C) (Oral)   Resp 20   Ht 5\' 5"  (1.651 m)   Wt 179 lb (81.2 kg)   SpO2 98%   BMI 29.79 kg/m      Assessment & Plan:  Kimberly Shaw comes in today with chief complaint of Mood swings   Diagnosis and orders addressed:  1. Mood swing - FSH/LH - escitalopram (LEXAPRO) 10 MG tablet; Take 1 tablet (10 mg total) by mouth daily.  Dispense: 90 tablet; Refill: 1  2. Hypothyroidism, unspecified type - TSH - FSH/LH  3. GAD (generalized anxiety disorder) - FSH/LH - escitalopram (LEXAPRO) 10 MG tablet;  Take 1 tablet (10 mg total) by mouth daily.  Dispense: 90 tablet; Refill: 1  4. Overweight (BMI 25.0-29.9)  Will start Lexapro 10 mg daily Stress management  Labs pending Once we get mood stable, may add phentermine to help with weight gain Health Maintenance reviewed Diet and exercise encouraged  Follow up plan: 4-6 weeks to recheck mood swings    Jannifer Rodney, FNP

## 2022-01-16 NOTE — Patient Instructions (Signed)
Major Depressive Disorder, Adult Major depressive disorder (MDD) is a mental health condition. It may also be called clinical depression or unipolar depression. MDD causes symptoms of sadness, hopelessness, and loss of interest in things. These symptoms last most of the day, almost every day, for 2 weeks. MDD can also cause physical symptoms. It can interfere with relationships and with everyday activities, such as work, school, and activities that are usually pleasant. MDD may be mild, moderate, or severe. It may be single-episode MDD, which happens once, or recurrent MDD, which may occur multiple times. What are the causes? The exact cause of this condition is not known. MDD is most likely caused by a combination of things, which may include: Your personality traits. Learned or conditioned behaviors or thoughts or feelings that reinforce negativity. Any alcohol or substance misuse. Long-term (chronic) physical or mental health illness. Going through a traumatic experience or major life changes. What increases the risk? The following factors may make someone more likely to develop MDD: A family history of depression. Being a woman. Troubled family relationships. Abnormally low levels of certain brain chemicals. Traumatic or painful events in childhood, especially abuse or loss of a parent. A lot of stress from life experiences, such as poor living conditions or discrimination. Chronic physical illness or other mental health disorders. What are the signs or symptoms? The main symptoms of MDD usually include: Constant depressed or irritable mood. A loss of interest in things and activities. Other symptoms include: Sleeping or eating too much or too little. Unexplained weight gain or weight loss. Tiredness or low energy. Being agitated, restless, or weak. Feeling hopeless, worthless, or guilty. Trouble thinking clearly or making decisions. Thoughts of suicide or thoughts of harming  others. Isolating oneself or avoiding other people or activities. Trouble completing tasks, work, or any normal obligations. Severe symptoms of this condition may include: Psychotic depression.This may include false beliefs, or delusions. It may also include seeing, hearing, tasting, smelling, or feeling things that are not real (hallucinations). Chronic depression or persistent depressive disorder. This is low-level depression that lasts for at least 2 years. Melancholic depression, or feeling extremely sad and hopeless. Catatonic depression, which includes trouble speaking and trouble moving. How is this diagnosed? This condition may be diagnosed based on: Your symptoms. Your medical and mental health history. You may be asked questions about your lifestyle, including any drug and alcohol use. A physical exam. Blood tests to rule out other conditions. MDD is confirmed if you have the following symptoms most of the day, nearly every day, in a 2-week period: Either a depressed mood or loss of interest. At least four other MDD symptoms. How is this treated? This condition is usually treated by mental health professionals, such as psychologists, psychiatrists, and clinical social workers. You may need more than one type of treatment. Treatment may include: Psychotherapy, also called talk therapy or counseling. Types of psychotherapy include: Cognitive behavioral therapy (CBT). This teaches you to recognize unhealthy feelings, thoughts, and behaviors, and replace them with positive thoughts and actions. Interpersonal therapy (IPT). This helps you to improve the way you communicate with others or relate to them. Family therapy. This treatment includes members of your family. Medicines to treat anxiety and depression. These medicines help to balance the brain chemicals that affect your emotions. Lifestyle changes. You may be asked to: Limit alcohol use and avoid drug use. Get regular  exercise. Get plenty of sleep. Make healthy eating choices. Spend more time outdoors. Brain stimulation. This may   be done if symptoms are very severe and other treatments have not worked. Examples of this treatment are electroconvulsive therapy and transcranial magnetic stimulation. Follow these instructions at home: Activity Exercise regularly and spend time outdoors. Find activities that you enjoy doing, and make time to do them. Find healthy ways to manage stress, such as: Meditation or deep breathing. Spending time in nature. Journaling. Return to your normal activities as told by your health care provider. Ask your health care provider what activities are safe for you. Alcohol and drug use If you drink alcohol: Limit how much you use to: 0-1 drink a day for women who are not pregnant. 0-2 drinks a day for men. Be aware of how much alcohol is in your drink. In the [place] one drink equals one 12 oz bottle of beer (355 mL), one 5 oz glass of wine (148 mL), or one 1 oz glass of hard liquor (44 mL). Discuss your alcohol use with your health care provider. Alcohol can affect any antidepressant medicines you are taking. Discuss any drug use with your health care provider. General instructions  Take over-the-counter and prescription medicines only as told by your health care provider. Eat a healthy diet and get plenty of sleep. Consider joining a support group. Your health care provider may be able to recommend one. Keep all follow-up visits as told by your health care provider. This is important. Where to find more information National Alliance on Mental Illness: www.nami.org [place] National Institute of Mental Health: www.nimh.nih.gov Contact a health care provider if: Your symptoms get worse. You develop new symptoms. Get help right away if: You self-harm. You have serious thoughts about hurting yourself or others. You hallucinate. If you ever feel like you may hurt yourself or  others, or have thoughts about taking your own life, get help right away. Go to your nearest emergency department or: Call your local emergency services (911 in the [place] Call a suicide crisis helpline, such as the National Suicide Prevention Lifeline at 1-800-273-8255 or 988 in the [place] This is open 24 hours a day in the [place] Text the Crisis Text Line at 741741 (in the [place] Summary Major depressive disorder (MDD) is a mental health condition. MDD causes symptoms of sadness, hopelessness, and loss of interest in things. These symptoms last most of the day, almost every day, for 2 weeks. The symptoms of MDD can interfere with relationships and with everyday activities. Treatments and support are available for people who develop MDD. You may need more than one type of treatment. Get help right away if you have serious thoughts about hurting yourself or others. This information is not intended to replace advice given to you by your health care provider. Make sure you discuss any questions you have with your health care provider. Document Revised: 12/04/2020 Document Reviewed: 04/22/2019 [employer] Patient Education  2023 [employer]  

## 2022-01-17 LAB — FSH/LH
FSH: 14.4 m[IU]/mL
LH: 15.8 m[IU]/mL

## 2022-01-17 LAB — TSH: TSH: 4.46 u[IU]/mL (ref 0.450–4.500)

## 2022-02-11 ENCOUNTER — Other Ambulatory Visit: Payer: Self-pay | Admitting: *Deleted

## 2022-02-12 MED ORDER — LEVOTHYROXINE SODIUM 75 MCG PO TABS: 75.0000 ug | ORAL_TABLET | Freq: Every day | ORAL | 3 refills | Status: DC

## 2022-02-20 ENCOUNTER — Encounter: Payer: Self-pay | Admitting: Family

## 2022-02-20 ENCOUNTER — Ambulatory Visit: Payer: BC Managed Care – PPO | Admitting: Family

## 2022-02-20 VITALS — BP 107/70 | HR 57 | Temp 97.4°F | Ht 65.0 in | Wt 180.0 lb

## 2022-02-20 DIAGNOSIS — E663 Overweight: Secondary | ICD-10-CM | POA: Insufficient documentation

## 2022-02-20 DIAGNOSIS — F411 Generalized anxiety disorder: Secondary | ICD-10-CM | POA: Insufficient documentation

## 2022-02-20 DIAGNOSIS — E669 Obesity, unspecified: Secondary | ICD-10-CM | POA: Insufficient documentation

## 2022-02-20 DIAGNOSIS — E039 Hypothyroidism, unspecified: Secondary | ICD-10-CM | POA: Diagnosis not present

## 2022-02-20 DIAGNOSIS — R4586 Emotional lability: Secondary | ICD-10-CM | POA: Diagnosis not present

## 2022-02-20 DIAGNOSIS — Z713 Dietary counseling and surveillance: Secondary | ICD-10-CM

## 2022-02-20 MED ORDER — PHENTERMINE HCL 37.5 MG PO TABS: 37.5000 mg | ORAL_TABLET | Freq: Every day | ORAL | 2 refills | Status: DC

## 2022-02-20 MED ORDER — LEVOTHYROXINE SODIUM 88 MCG PO TABS: 88.0000 ug | ORAL_TABLET | Freq: Every day | ORAL | 3 refills | Status: DC

## 2022-02-20 NOTE — Progress Notes (Signed)
Subjective:    Patient ID: Kimberly Shaw, female    DOB: 09-30-1975, 46 y.o.   MRN: [id number]  Chief Complaint  Patient presents with   Follow-up    ? On thyroid meds    PT presents to the office today to recheck GAD. She was seen on 01/16/22 and we started her on Lexapro 10 mg. States this has helped her irritability and mood swings.  She  is requesting medications to help for weight loss. She reports she has gained 30 lbs over the last 2018. She has joined a gym and exercising three times a week for 1-2 hours.   States she has been on levothyroxine 88 mcg from previous provider, however, when refilled levothyroxine 75 mcg was sent in. It appears in our system it appears she was taking 75 mcg. Will send in new rx of 88 mcg since this is her current dose and labs were normal.  Anxiety Presents for follow-up visit. Symptoms include excessive worry, insomnia, nervous/anxious behavior and restlessness. Patient reports no irritability. Symptoms occur occasionally. The severity of symptoms is mild.    Thyroid Problem Presents for follow-up visit. Symptoms include anxiety and fatigue. Patient reports no constipation or dry skin. The symptoms have been stable.      Review of Systems  Constitutional:  Positive for fatigue. Negative for irritability.  Gastrointestinal:  Negative for constipation.  Psychiatric/Behavioral:  The patient is nervous/anxious and has insomnia.   All other systems reviewed and are negative.      Objective:   Physical Exam Vitals reviewed.  Constitutional:      General: She is not in acute distress.    Appearance: She is well-developed.  HENT:     Head: Normocephalic and atraumatic.     Right Ear: Tympanic membrane normal.     Left Ear: Tympanic membrane normal.  Eyes:     Pupils: Pupils are equal, round, and reactive to light.  Neck:     Thyroid: No thyromegaly.  Cardiovascular:     Rate and Rhythm: Normal rate and regular rhythm.     Heart sounds:  Normal heart sounds. No murmur heard. Pulmonary:     Effort: Pulmonary effort is normal. No respiratory distress.     Breath sounds: Normal breath sounds. No wheezing.  Abdominal:     General: Bowel sounds are normal. There is no distension.     Palpations: Abdomen is soft.     Tenderness: There is no abdominal tenderness.  Musculoskeletal:        General: No tenderness. Normal range of motion.     Cervical back: Normal range of motion and neck supple.  Skin:    General: Skin is warm and dry.  Neurological:     Mental Status: She is alert and oriented to person, place, and time.     Cranial Nerves: No cranial nerve deficit.     Deep Tendon Reflexes: Reflexes are normal and symmetric.  Psychiatric:        Behavior: Behavior normal.        Thought Content: Thought content normal.        Judgment: Judgment normal.       BP 107/70   Pulse (!) 57   Temp (!) 97.4 F (36.3 C) (Temporal)   Ht 5' 5" (1.651 m)   Wt 180 lb (81.6 kg)   BMI 29.95 kg/m      Assessment & Plan:  Glenna Brunkow comes in today with chief complaint of Follow-up (?  On thyroid meds )   Diagnosis and orders addressed:  1. GAD (generalized anxiety disorder) - CMP14+EGFR  2. Mood swing - CMP14+EGFR  3. Hypothyroidism, unspecified type - CMP14+EGFR  4. Overweight (BMI 25.0-29.9) - CMP14+EGFR  5. Encounter for weight loss counseling - CMP14+EGFR   Labs pending Health Maintenance reviewed Diet and exercise encouraged  Follow up plan: 2 months for weight loss   Evelina Dun, FNP

## 2022-02-20 NOTE — Patient Instructions (Signed)

## 2022-02-21 LAB — CMP14+EGFR
ALT: 10 IU/L (ref 0–32)
AST: 14 IU/L (ref 0–40)
Albumin/Globulin Ratio: 1.6 (ref 1.2–2.2)
Albumin: 4 g/dL (ref 3.9–4.9)
Alkaline Phosphatase: 87 IU/L (ref 44–121)
BUN/Creatinine Ratio: 11 (ref 9–23)
BUN: 9 mg/dL (ref 6–24)
Bilirubin Total: 0.5 mg/dL (ref 0.0–1.2)
CO2: 22 mmol/L (ref 20–29)
Calcium: 8.4 mg/dL — ABNORMAL LOW (ref 8.7–10.2)
Chloride: 106 mmol/L (ref 96–106)
Creatinine, Ser: 0.85 mg/dL (ref 0.57–1.00)
Globulin, Total: 2.5 g/dL (ref 1.5–4.5)
Glucose: 83 mg/dL (ref 70–99)
Potassium: 4.2 mmol/L (ref 3.5–5.2)
Sodium: 142 mmol/L (ref 134–144)
Total Protein: 6.5 g/dL (ref 6.0–8.5)
eGFR: 84 mL/min/{1.73_m2} (ref >59)

## 2022-03-06 ENCOUNTER — Ambulatory Visit (INDEPENDENT_AMBULATORY_CARE_PROVIDER_SITE_OTHER): Payer: BC Managed Care – PPO

## 2022-03-06 ENCOUNTER — Encounter: Payer: Self-pay | Admitting: Family Medicine

## 2022-03-06 ENCOUNTER — Ambulatory Visit: Payer: BC Managed Care – PPO | Admitting: Family Medicine

## 2022-03-06 VITALS — BP 104/71 | HR 78 | Temp 97.4°F | Ht 65.0 in | Wt 177.4 lb

## 2022-03-06 DIAGNOSIS — M25872 Other specified joint disorders, left ankle and foot: Secondary | ICD-10-CM

## 2022-03-06 DIAGNOSIS — M79672 Pain in left foot: Secondary | ICD-10-CM

## 2022-03-06 DIAGNOSIS — M19072 Primary osteoarthritis, left ankle and foot: Secondary | ICD-10-CM | POA: Diagnosis not present

## 2022-03-06 MED ORDER — NAPROXEN 500 MG PO TABS: 500.0000 mg | ORAL_TABLET | Freq: Two times a day (BID) | ORAL | 0 refills | Status: AC

## 2022-03-06 NOTE — Progress Notes (Signed)
Subjective:  Patient ID: Kimberly Shaw, female    DOB: 05-15-1976, 46 y.o.   MRN: [id number]  Patient Care Team: Sharion Balloon, FNP as PCP - [facility] ([facility], [facility] (Obstetrics and Gynecology)   Chief Complaint:  Foot Pain (Left foot pain after leaving gym on Wednesday )   HPI: Kimberly Shaw is a 46 y.o. female presenting on 03/06/2022 for Foot Pain (Left foot pain after leaving gym on Wednesday )   Pt states she recently joined a running club and has started working out at [employer]. States on Wednesday when she left the gym she developed a pain in her forefoot which felt like a bad cramp. States the pain has not subsided. No loss of function or specific injury. Has taken motrin with some relief of symptoms.   Foot Pain This is a new problem. Episode onset: Wednesday. The problem occurs constantly. The problem has been unchanged. Associated symptoms include arthralgias. Pertinent negatives include no abdominal pain, anorexia, change in bowel habit, chest pain, chills, congestion, coughing, diaphoresis, fatigue, fever, headaches, joint swelling, myalgias, nausea, neck pain, numbness, rash, sore throat, swollen glands, urinary symptoms, vertigo, visual change, vomiting or weakness. The symptoms are aggravated by walking (pressure to area). She has tried NSAIDs for the symptoms. The treatment provided mild relief.     Relevant past medical, surgical, family, and social history reviewed and updated as indicated.  Allergies and medications reviewed and updated. Data reviewed: Chart in Epic.   Past Medical History:  Diagnosis Date   GERD (gastroesophageal reflux disease)    Hypothyroidism    Migraines    Thyroid disease     Past Surgical History:  Procedure Laterality Date   BREAST ENHANCEMENT SURGERY     EYE SURGERY     HEMI-MICRODISCECTOMY LUMBAR LAMINECTOMY LEVEL 1 Left 01/01/2016   Procedure: CENTRAL DECOMPRESSION L5,S1 FOR STENOSIS, FORAMINOTOMY S1 NERVE  ROOT ON LEFT, MICRODISCECTOMY L5,S1;  Surgeon: Latanya Maudlin, MD;  Location: [facility];  Service: Orthopedics;  Laterality: Left;   tummy tuck surgery       Social History   Socioeconomic History   Marital status: Married    Spouse name: Not on file   Number of children: Not on file   Years of education: Not on file   Highest education level: Not on file  Occupational History   Not on file  Tobacco Use   Smoking status: Never   Smokeless tobacco: Never  Substance and Sexual Activity   Alcohol use: Yes    Comment: rare   Drug use: No   Sexual activity: Not on file  Other Topics Concern   Not on file  Social History Narrative   Not on file   Social Determinants of Health   Financial Resource Strain: Not on file  Food Insecurity: Not on file  Transportation Needs: Not on file  Physical Activity: Not on file  Stress: Not on file  Social Connections: Not on file  Intimate Partner Violence: Not on file    Outpatient Encounter Medications as of 03/06/2022  Medication Sig   escitalopram (LEXAPRO) 10 MG tablet Take 1 tablet (10 mg total) by mouth daily.   levothyroxine (SYNTHROID) 88 MCG tablet Take 1 tablet (88 mcg total) by mouth daily.   naproxen (NAPROSYN) 500 MG tablet Take 1 tablet (500 mg total) by mouth 2 (two) times daily with a meal for 14 days.   omeprazole (PRILOSEC) 40 MG capsule Take 1 capsule (40 mg  total) by mouth daily.   phentermine (ADIPEX-P) 37.5 MG tablet Take 1 tablet (37.5 mg total) by mouth daily before breakfast.   polyethylene glycol (MIRALAX / GLYCOLAX) 17 g packet Take 17 g by mouth daily.   No facility-administered encounter medications on file as of 03/06/2022.    Allergies  Allergen Reactions   Bee Venom     Swelling    Mobic [Meloxicam] Other (See Comments)    Burning in throat    Review of Systems  Constitutional:  Negative for activity change, appetite change, chills, diaphoresis, fatigue, fever and unexpected weight change.  HENT:  Negative.  Negative for congestion and sore throat.   Eyes: Negative.   Respiratory:  Negative for cough, chest tightness and shortness of breath.   Cardiovascular:  Negative for chest pain, palpitations and leg swelling.  Gastrointestinal:  Negative for abdominal pain, anorexia, blood in stool, change in bowel habit, constipation, diarrhea, nausea and vomiting.  Endocrine: Negative.   Genitourinary:  Negative for decreased urine volume, difficulty urinating, dysuria, frequency and urgency.  Musculoskeletal:  Positive for arthralgias and gait problem. Negative for back pain, joint swelling, myalgias, neck pain and neck stiffness.  Skin: Negative.  Negative for rash.  Allergic/Immunologic: Negative.   Neurological:  Negative for dizziness, vertigo, tremors, seizures, syncope, facial asymmetry, speech difficulty, weakness, light-headedness, numbness and headaches.  Hematological: Negative.   Psychiatric/Behavioral:  Negative for confusion, hallucinations, sleep disturbance and suicidal ideas.   All other systems reviewed and are negative.       Objective:  BP 104/71   Pulse 78   Temp (!) 97.4 F (36.3 C) (Temporal)   Ht 5' 5"  (1.651 m)   Wt 177 lb 6.4 oz (80.5 kg)   LMP 02/20/2022   SpO2 94%   BMI 29.52 kg/m    Wt Readings from Last 3 Encounters:  03/06/22 177 lb 6.4 oz (80.5 kg)  02/20/22 180 lb (81.6 kg)  01/16/22 179 lb (81.2 kg)    Physical Exam Vitals and nursing note reviewed.  Constitutional:      General: She is not in acute distress.    Appearance: Normal appearance. She is obese. She is not ill-appearing, toxic-appearing or diaphoretic.  HENT:     Head: Normocephalic and atraumatic.  Eyes:     Pupils: Pupils are equal, round, and reactive to light.  Cardiovascular:     Rate and Rhythm: Normal rate and regular rhythm.     Pulses: Normal pulses.     Heart sounds: Normal heart sounds. No murmur heard.    No friction rub. No gallop.  Pulmonary:     Effort:  Pulmonary effort is normal.     Breath sounds: Normal breath sounds.  Abdominal:     General: Abdomen is flat.  Musculoskeletal:     Cervical back: Normal range of motion and neck supple.     Right lower leg: No edema.     Left lower leg: No edema.     Left ankle: Normal.     Left Achilles Tendon: Normal.     Left foot: Normal range of motion and normal capillary refill. Tenderness present. No swelling, deformity, bunion, Charcot foot, foot drop, prominent metatarsal heads, laceration, bony tenderness or crepitus. Normal pulse.       Feet:  Skin:    General: Skin is warm and dry.     Capillary Refill: Capillary refill takes less than 2 seconds.  Neurological:     General: No focal deficit present.  Mental Status: She is alert and oriented to person, place, and time.  Psychiatric:        Mood and Affect: Mood normal.        Behavior: Behavior normal.        Thought Content: Thought content normal.        Judgment: Judgment normal.     Results for orders placed or performed in visit on 02/20/22  CMP14+EGFR  Result Value Ref Range   Glucose 83 70 - 99 mg/dL   BUN 9 6 - 24 mg/dL   Creatinine, Ser 0.85 0.57 - 1.00 mg/dL   eGFR 84 >59 mL/min/1.73   BUN/Creatinine Ratio 11 9 - 23   Sodium 142 134 - 144 mmol/L   Potassium 4.2 3.5 - 5.2 mmol/L   Chloride 106 96 - 106 mmol/L   CO2 22 20 - 29 mmol/L   Calcium 8.4 (L) 8.7 - 10.2 mg/dL   Total Protein 6.5 6.0 - 8.5 g/dL   Albumin 4.0 3.9 - 4.9 g/dL   Globulin, Total 2.5 1.5 - 4.5 g/dL   Albumin/Globulin Ratio 1.6 1.2 - 2.2   Bilirubin Total 0.5 0.0 - 1.2 mg/dL   Alkaline Phosphatase 87 44 - 121 IU/L   AST 14 0 - 40 IU/L   ALT 10 0 - 32 IU/L     X-Ray: left foot: sesamoid bones present. Preliminary x-ray reading by Monia Pouch, FNP-C, [facility].   Pertinent labs & imaging results that were available during my care of the patient were reviewed by me and considered in my medical decision making.  Assessment & Plan:  Jaislyn was  seen today for foot pain.  Diagnoses and all orders for this visit:  Left foot pain Sesamoiditis Imaging as noted. Symptomatic care discussed in detail. Will burst with NSAID therapy. Placed in cast shoe today. Pt aware to wear wide toebox shoes and to place insert in shoe. Report new, worsening, or persistent symptoms. May need referral to podiatry if remains symptomatic.  -     DG Foot Complete Left; Future -     naproxen (NAPROSYN) 500 MG tablet; Take 1 tablet (500 mg total) by mouth 2 (two) times daily with a meal for 14 days.     Continue all other maintenance medications.  Follow up plan: Return if symptoms worsen or fail to improve.   Continue healthy lifestyle choices, including diet (rich in fruits, vegetables, and lean proteins, and low in salt and simple carbohydrates) and exercise (at least 30 minutes of moderate physical activity daily).  Educational handout given for sesamoid injury  The above assessment and management plan was discussed with the patient. The patient verbalized understanding of and has agreed to the management plan. Patient is aware to call the clinic if they develop any new symptoms or if symptoms persist or worsen. Patient is aware when to return to the clinic for a follow-up visit. Patient educated on when it is appropriate to go to the emergency department.   Monia Pouch, FNP-C [facility] [phone]

## 2022-03-31 ENCOUNTER — Other Ambulatory Visit: Payer: Self-pay | Admitting: Family

## 2022-04-08 ENCOUNTER — Other Ambulatory Visit: Payer: Self-pay | Admitting: *Deleted

## 2022-04-08 DIAGNOSIS — R4586 Emotional lability: Secondary | ICD-10-CM

## 2022-04-08 DIAGNOSIS — F411 Generalized anxiety disorder: Secondary | ICD-10-CM

## 2022-04-08 MED ORDER — ESCITALOPRAM OXALATE 10 MG PO TABS: 10.0000 mg | ORAL_TABLET | Freq: Every day | ORAL | 0 refills | Status: DC

## 2022-05-24 ENCOUNTER — Other Ambulatory Visit: Payer: Self-pay | Admitting: Family

## 2022-05-24 DIAGNOSIS — R4586 Emotional lability: Secondary | ICD-10-CM

## 2022-05-24 DIAGNOSIS — F411 Generalized anxiety disorder: Secondary | ICD-10-CM

## 2022-05-26 NOTE — Telephone Encounter (Signed)
[facility] NTBS in Jan for 3 mos FU, RF was sent in

## 2022-05-26 NOTE — Telephone Encounter (Signed)
PT MADE APPT FOR JAN 23

## 2022-06-07 ENCOUNTER — Other Ambulatory Visit: Payer: Self-pay | Admitting: Family

## 2022-06-16 ENCOUNTER — Encounter: Payer: Self-pay | Admitting: Family

## 2022-06-16 ENCOUNTER — Ambulatory Visit: Payer: BC Managed Care – PPO | Admitting: Family

## 2022-06-16 VITALS — BP 105/68 | HR 65 | Temp 97.4°F | Ht 65.0 in | Wt 175.2 lb

## 2022-06-16 DIAGNOSIS — E663 Overweight: Secondary | ICD-10-CM

## 2022-06-16 DIAGNOSIS — K219 Gastro-esophageal reflux disease without esophagitis: Secondary | ICD-10-CM

## 2022-06-16 DIAGNOSIS — F411 Generalized anxiety disorder: Secondary | ICD-10-CM

## 2022-06-16 DIAGNOSIS — Z713 Dietary counseling and surveillance: Secondary | ICD-10-CM

## 2022-06-16 DIAGNOSIS — E039 Hypothyroidism, unspecified: Secondary | ICD-10-CM | POA: Diagnosis not present

## 2022-06-16 DIAGNOSIS — R4586 Emotional lability: Secondary | ICD-10-CM | POA: Diagnosis not present

## 2022-06-16 MED ORDER — OMEPRAZOLE 40 MG PO CPDR: 40.0000 mg | DELAYED_RELEASE_CAPSULE | Freq: Every day | ORAL | 2 refills | Status: DC

## 2022-06-16 MED ORDER — PHENTERMINE HCL 37.5 MG PO TABS: 37.5000 mg | ORAL_TABLET | Freq: Every day | ORAL | 2 refills | Status: DC

## 2022-06-16 MED ORDER — ESCITALOPRAM OXALATE 10 MG PO TABS: 10.0000 mg | ORAL_TABLET | Freq: Every day | ORAL | 2 refills | Status: DC

## 2022-06-16 NOTE — Progress Notes (Signed)
Subjective:    Patient ID: Kimberly Shaw, female    DOB: [birth year], 47 y.o.   MRN: [id number]  Chief Complaint  Patient presents with   Medical Management of Chronic Issues   PT presents to the office today for chronic follow up and weight loss. Reports she only took phentermine for one month and hurt her foot and was not able to exercise.      06/16/2022    9:35 AM 03/06/2022    1:57 PM 02/20/2022   11:37 AM  Last 3 Weights  Weight (lbs) 175 lb 3.2 oz 177 lb 6.4 oz 180 lb  Weight (kg) 79.47 kg 80.468 kg 81.647 kg     Thyroid Problem Presents for follow-up visit. Symptoms include anxiety, constipation, dry skin and fatigue. Patient reports no hoarse voice. The symptoms have been stable.  Gastroesophageal Reflux She complains of belching. She reports no hoarse voice. This is a chronic problem. The current episode started more than 1 year ago. The problem occurs occasionally. Associated symptoms include fatigue. She has tried a PPI for the symptoms. The treatment provided moderate relief.      Review of Systems  Constitutional:  Positive for fatigue.  HENT:  Negative for hoarse voice.   Gastrointestinal:  Positive for constipation.  Psychiatric/Behavioral:  The patient is nervous/anxious.   All other systems reviewed and are negative.      Objective:   Physical Exam Vitals reviewed.  Constitutional:      General: She is not in acute distress.    Appearance: She is well-developed.  HENT:     Head: Normocephalic and atraumatic.     Right Ear: Tympanic membrane normal.     Left Ear: Tympanic membrane normal.  Eyes:     Pupils: Pupils are equal, round, and reactive to light.  Neck:     Thyroid: No thyromegaly.  Cardiovascular:     Rate and Rhythm: Normal rate and regular rhythm.     Heart sounds: Normal heart sounds. No murmur heard. Pulmonary:     Effort: Pulmonary effort is normal. No respiratory distress.     Breath sounds: Normal breath sounds. No wheezing.   Abdominal:     General: Bowel sounds are normal. There is no distension.     Palpations: Abdomen is soft.     Tenderness: There is no abdominal tenderness.  Musculoskeletal:        General: No tenderness. Normal range of motion.     Cervical back: Normal range of motion and neck supple.  Skin:    General: Skin is warm and dry.  Neurological:     Mental Status: She is alert and oriented to person, place, and time.     Cranial Nerves: No cranial nerve deficit.     Deep Tendon Reflexes: Reflexes are normal and symmetric.  Psychiatric:        Behavior: Behavior normal.        Thought Content: Thought content normal.        Judgment: Judgment normal.      BP 105/68   Pulse 65   Temp (!) 97.4 F (36.3 C) (Temporal)   Ht 5\' 5"  (1.651 m)   Wt 175 lb 3.2 oz (79.5 kg)   SpO2 98%   BMI 29.15 kg/m      Assessment & Plan:  Kimberly Shaw comes in today with chief complaint of Medical Management of Chronic Issues   Diagnosis and orders addressed:  1. GAD (generalized anxiety disorder) - escitalopram (  LEXAPRO) 10 MG tablet; Take 1 tablet (10 mg total) by mouth daily.  Dispense: 90 tablet; Refill: 2 - CMP14+EGFR  2. Mood swing - escitalopram (LEXAPRO) 10 MG tablet; Take 1 tablet (10 mg total) by mouth daily.  Dispense: 90 tablet; Refill: 2 - CMP14+EGFR  3. Overweight (BMI 25.0-29.9) - phentermine (ADIPEX-P) 37.5 MG tablet; Take 1 tablet (37.5 mg total) by mouth daily before breakfast.  Dispense: 30 tablet; Refill: 2 - CMP14+EGFR  4. Hypothyroidism, unspecified type - CMP14+EGFR - TSH  5. Gastroesophageal reflux disease, unspecified whether esophagitis present  - CMP14+EGFR  6. Weight loss counseling, encounter for - phentermine (ADIPEX-P) 37.5 MG tablet; Take 1 tablet (37.5 mg total) by mouth daily before breakfast.  Dispense: 30 tablet; Refill: 2 - CMP14+EGFR   Labs pending Health Maintenance reviewed Diet and exercise encouraged  Follow up plan: 3 months     Evelina Dun, FNP

## 2022-06-16 NOTE — Patient Instructions (Signed)

## 2022-06-17 LAB — CMP14+EGFR
ALT: 13 IU/L (ref 0–32)
AST: 11 IU/L (ref 0–40)
Albumin/Globulin Ratio: 1.8 (ref 1.2–2.2)
Albumin: 4 g/dL (ref 3.9–4.9)
Alkaline Phosphatase: 86 IU/L (ref 44–121)
BUN/Creatinine Ratio: 13 (ref 9–23)
BUN: 10 mg/dL (ref 6–24)
Bilirubin Total: 0.5 mg/dL (ref 0.0–1.2)
CO2: 23 mmol/L (ref 20–29)
Calcium: 8.9 mg/dL (ref 8.7–10.2)
Chloride: 104 mmol/L (ref 96–106)
Creatinine, Ser: 0.78 mg/dL (ref 0.57–1.00)
Globulin, Total: 2.2 g/dL (ref 1.5–4.5)
Glucose: 96 mg/dL (ref 70–99)
Potassium: 4.3 mmol/L (ref 3.5–5.2)
Sodium: 139 mmol/L (ref 134–144)
Total Protein: 6.2 g/dL (ref 6.0–8.5)
eGFR: 94 mL/min/{1.73_m2} (ref >59)

## 2022-06-17 LAB — TSH: TSH: 4.15 u[IU]/mL (ref 0.450–4.500)

## 2022-09-18 ENCOUNTER — Ambulatory Visit: Payer: BC Managed Care – PPO | Admitting: Family

## 2022-09-18 VITALS — BP 104/72 | HR 85 | Ht 65.0 in | Wt 175.0 lb

## 2022-09-18 DIAGNOSIS — R5383 Other fatigue: Secondary | ICD-10-CM | POA: Diagnosis not present

## 2022-09-18 DIAGNOSIS — G2581 Restless legs syndrome: Secondary | ICD-10-CM | POA: Diagnosis not present

## 2022-09-18 DIAGNOSIS — E039 Hypothyroidism, unspecified: Secondary | ICD-10-CM

## 2022-09-18 DIAGNOSIS — N921 Excessive and frequent menstruation with irregular cycle: Secondary | ICD-10-CM

## 2022-09-18 NOTE — Patient Instructions (Addendum)
Menorrhagia Menorrhagia is a form of abnormal uterine bleeding in which menstrual periods are heavy or last longer than normal. With menorrhagia, the periods may cause enough blood loss and cramping that a woman becomes unable to take part in her usual activities. What are the causes? Common causes of this condition include: Polyps or fibroids. These are noncancerous growths in the uterus. An imbalance of the hormones estrogen and progesterone. Anovulation, which occurs when one of the ovaries does not release an egg during one or more months. A problem with the thyroid gland (hypothyroidism). Side effects of having an intrauterine device (IUD). Side effects of some medicines, such as NSAIDs or blood thinners. A bleeding disorder that stops the blood from clotting normally. In some cases, the cause of this condition is not known. What increases the risk? You are more likely to develop this condition if you have cancer of the uterus. What are the signs or symptoms? Symptoms of this condition include: Routinely having to change your pad or tampon every 1-2 hours because it is soaked. Needing to use pads and tampons at the same time because of heavy bleeding. Needing to wake up to change your pads or tampons during the night. Passing blood clots larger than 1 inch (2.5 cm) in size. Having bleeding that lasts for more than 7 days. Having symptoms of low iron levels (anemia), such as tiredness (fatigue) or shortness of breath. How is this diagnosed? This condition may be diagnosed based on: A physical exam. Your symptoms and menstrual history. Tests, such as: Blood tests to check if you are pregnant or if you have hormonal changes, a bleeding or thyroid disorder, anemia, or other problems. Pap test to check for cancerous changes, infections, or inflammation. Endometrial biopsy. This test involves removing a tissue sample from the lining of the uterus (endometrium) to be examined under a  microscope. Pelvic ultrasound. This test uses sound waves to create images of your uterus, ovaries, and vagina. The images can show if you have fibroids or other growths. Hysteroscopy. For this test, a thin, flexible tube with a light on the end (hysteroscope) is used to look inside your uterus. How is this treated? Treatment may not be needed for this condition. If it is needed, the best treatment for you will depend on: Whether you need to prevent pregnancy. Your desire to have children in the future. The cause and severity of your bleeding. Your personal preference. Medicine Medicines are the first step in treatment. You may be treated with: Hormonal birth control methods. These treatments reduce bleeding during your menstrual period. They include: Birth control pills. Skin patch. Vaginal ring. Shots (injections) that you get every 3 months. Hormonal IUD. Implants that go under the skin. Medicines that thicken the blood and slow bleeding. Medicines that reduce swelling, such as ibuprofen. Medicines that contain an artificial (synthetic) hormone called progestin. Medicines that make the ovaries stop working for a short time. Iron supplements to treat anemia.  Surgery If medicines do not work, surgery may be done. Surgical options may include: Dilation and curettage (D&C). In this procedure, your health care provider opens the lowest part of the uterus (cervix) and then scrapes or suctions tissue from the endometrium. This reduces menstrual bleeding. Operative hysteroscopy. In this procedure, a hysteroscope is used to view your uterus and help remove polyps that may be causing heavy periods. Endometrial ablation. This is when various techniques are used to permanently destroy your entire endometrium. After endometrial ablation, most women have little   or no menstrual flow. This procedure reduces your ability to become pregnant. Endometrial resection. In this procedure, an electrosurgical  wire loop is used to remove the endometrium. This procedure reduces your ability to become pregnant. Hysterectomy. This is surgical removal of your uterus. This is a permanent procedure that stops menstrual periods. Pregnancy is not possible after a hysterectomy. Follow these instructions at home: Medicines Take over-the-counter and prescription medicines only as told by your health care provider. This includes iron pills. Do not change or switch medicines without asking your health care provider. Do not take aspirin or medicines that contain aspirin 1 week before or during your menstrual period. Aspirin may make bleeding worse. Managing constipation Your iron pills may cause constipation. If you are taking prescription iron supplements, you may need to take these actions to prevent or treat constipation: Drink enough fluid to keep your urine pale yellow. Take over-the-counter or prescription medicines. Eat foods that are high in fiber, such as beans, whole grains, and fresh fruits and vegetables. Limit foods that are high in fat and processed sugars, such as fried or sweet foods. General instructions If you need to change your sanitary pad or tampon more than once every 2 hours, limit your activity until the bleeding stops. Eat well-balanced meals, including foods that are high in iron. Foods that have a lot of iron include leafy green vegetables, meat, liver, eggs, and whole-grain breads and cereals. Do not try to lose weight until the abnormal bleeding has stopped and your blood iron level is back to normal. If you need to lose weight, work with your health care provider to lose weight safely. Keep all follow-up visits. This is important. Contact a health care provider if: You soak through a pad or tampon every 1 or 2 hours, and this happens every time you have a period. You need to use pads and tampons at the same time because you are bleeding so much. You have nausea, vomiting, diarrhea, or  other problems related to medicines you are taking. Get help right away if: You soak through more than a pad or tampon in 1 hour. You pass clots bigger than 1 inch (2.5 cm) wide. You feel short of breath. You feel like your heart is beating too fast. You feel dizzy or you faint. You feel very weak or tired. Summary Menorrhagia is a form of abnormal uterine bleeding in which menstrual periods are heavy or last longer than normal. Treatment may not be needed for this condition. If it is needed, it may include medicines or procedures. Take over-the-counter and prescription medicines only as told by your health care provider. This includes iron pills. Get help right away if you have heavy bleeding that soaks through more than a pad or tampon in 1 hour, you pass large clots, or you feel dizzy, short of breath, or very weak or tired. This information is not intended to replace advice given to you by your health care provider. Make sure you discuss any questions you have with your health care provider.  Restless Legs Syndrome Restless legs syndrome is a condition that causes uncomfortable feelings or sensations in the legs, especially while sitting or lying down. The sensations usually cause an overwhelming urge to move the legs. The arms can also sometimes be affected. The condition can range from mild to severe. The symptoms often interfere with a person's ability to sleep. What are the causes? The cause of this condition is not known. What increases the risk? The  following factors may make you more likely to develop this condition: Being older than 50. Pregnancy. Being a woman. In general, the condition is more common in women than in men. A family history of the condition. Having iron deficiency. Overuse of caffeine, nicotine, or alcohol. Certain medical conditions, such as kidney disease, Parkinson's disease, or nerve damage. Certain medicines, such as those for high blood pressure,  nausea, colds, allergies, depression, and some heart conditions. What are the signs or symptoms? The main symptom of this condition is uncomfortable sensations in the legs, such as: Pulling. Tingling. Prickling. Throbbing. Crawling. Burning. Usually, the sensations: Affect both sides of the body. Are worse when you sit or lie down. Are worse at night. These may make it difficult to fall asleep. Make you have a strong urge to move your legs. Are temporarily relieved by moving your legs or standing. The arms can also be affected, but this is rare. People who have this condition often have tiredness during the day because of their lack of sleep at night. How is this diagnosed? This condition may be diagnosed based on: Your symptoms. Blood tests. In some cases, you may be monitored in a sleep lab by a specialist (a sleep study). This can detect any disruptions in your sleep. How is this treated? This condition is treated by managing the symptoms. This may include: Lifestyle changes, such as exercising, using relaxation techniques, and avoiding caffeine, alcohol, or tobacco. Iron supplements. Medicines. Parkinson's medications may be tried first. Anti-seizure medications can also be helpful. Follow these instructions at home: General instructions Take over-the-counter and prescription medicines only as told by your health care provider. Use methods to help relieve the uncomfortable sensations, such as: Massaging your legs. Walking or stretching. Taking a cold or hot bath. Keep all follow-up visits. This is important. Lifestyle     Practice good sleep habits. For example, go to bed and get up at the same time every day. Most adults should get 7-9 hours of sleep each night. Exercise regularly. Try to get at least 30 minutes of exercise most days of the week. Practice ways of relaxing, such as yoga or meditation. Avoid caffeine and alcohol. Do not use any products that contain  nicotine or tobacco. These products include cigarettes, chewing tobacco, and vaping devices, such as e-cigarettes. If you need help quitting, ask your health care provider. Where to find more information [employer] [facility] Disorders [facility] Stroke: [employer] Contact a health care provider if: Your symptoms get worse or they do not improve with treatment. Summary Restless legs syndrome is a condition that causes uncomfortable feelings or sensations in the legs, especially while sitting or lying down. The symptoms often interfere with your ability to sleep. This condition is treated by managing the symptoms. You may need to make lifestyle changes or take medicines. This information is not intended to replace advice given to you by your health care provider. Make sure you discuss any questions you have with your health care provider. Document Revised: 12/22/2020 Document Reviewed: 12/22/2020 Elsevier Patient Education  2023 Elsevier Inc.  Document Revised: 01/23/2020 Document Reviewed: 01/23/2020 Elsevier Patient Education  2023 [employer].

## 2022-09-18 NOTE — Progress Notes (Signed)
Subjective:    Patient ID: Kimberly Shaw, female    DOB: 09-30-1975, 47 y.o.   MRN: [id number]  Chief Complaint  Patient presents with   Medical Management of Chronic Issues    Thinks she has started menopause and restless leg    Pt presents to the office today to check weight. She was started on Phentermine 01/24, stopped this after one month because of headaches and restless leg.      09/18/2022   12:17 PM 06/16/2022    9:35 AM 03/06/2022    1:57 PM  Last 3 Weights  Weight (lbs) 175 lb 175 lb 3.2 oz 177 lb 6.4 oz  Weight (kg) 79.379 kg 79.47 kg 80.468 kg    Reports over the last year she has tingling of her legs, jerking of her legs, and feels like she has to constantly move her legs. Reports this is worse at night when she is very tired.   She reports having abnormal bleeding with heavy bleeding since 07/21/22 Thyroid Problem Presents for follow-up visit. Symptoms include anxiety, depressed mood and fatigue. Patient reports no constipation. The symptoms have been stable.      Review of Systems  Constitutional:  Positive for fatigue.  Gastrointestinal:  Negative for constipation.  Psychiatric/Behavioral:  The patient is nervous/anxious.   All other systems reviewed and are negative.      Objective:   Physical Exam Vitals reviewed.  Constitutional:      General: She is not in acute distress.    Appearance: She is well-developed.  HENT:     Head: Normocephalic and atraumatic.  Eyes:     Pupils: Pupils are equal, round, and reactive to light.  Neck:     Thyroid: No thyromegaly.  Cardiovascular:     Rate and Rhythm: Normal rate and regular rhythm.     Heart sounds: Normal heart sounds. No murmur heard. Pulmonary:     Effort: Pulmonary effort is normal. No respiratory distress.     Breath sounds: Normal breath sounds. No wheezing.  Abdominal:     General: Bowel sounds are normal. There is no distension.     Palpations: Abdomen is soft.     Tenderness: There is no  abdominal tenderness.  Musculoskeletal:        General: No tenderness. Normal range of motion.     Cervical back: Normal range of motion and neck supple.  Skin:    General: Skin is warm and dry.  Neurological:     Mental Status: She is alert and oriented to person, place, and time.     Cranial Nerves: No cranial nerve deficit.     Deep Tendon Reflexes: Reflexes are normal and symmetric.  Psychiatric:        Behavior: Behavior normal.        Thought Content: Thought content normal.        Judgment: Judgment normal.       BP 104/72   Pulse 85   Ht 5\' 5"  (1.651 m)   Wt 175 lb (79.4 kg)   SpO2 93%   BMI 29.12 kg/m      Assessment & Plan:   Imo Cumbie comes in today with chief complaint of Medical Management of Chronic Issues (Thinks she has started menopause and restless leg )   Diagnosis and orders addressed:  1. Menorrhagia with irregular cycle Bleeding improving over the last few weeks. IF continues or worsens will need referral to GYN - Iron, TIBC and Ferritin Panel -  CMP14+EGFR - TSH  2. Restless leg Using OTC, will let me know if she wants to try Requip Limit caffeine in evening - CMP14+EGFR - TSH  3. Hypothyroidism, unspecified type - CMP14+EGFR - TSH  4. Other fatigue - Iron, TIBC and Ferritin Panel - CMP14+EGFR - TSH   Labs pending Health Maintenance reviewed Diet and exercise encouraged  Follow up plan: 6 months    Jannifer Rodney, FNP

## 2022-09-19 LAB — CMP14+EGFR
ALT: 13 IU/L (ref 0–32)
AST: 14 IU/L (ref 0–40)
Albumin/Globulin Ratio: 1.7 (ref 1.2–2.2)
Albumin: 4 g/dL (ref 3.9–4.9)
Alkaline Phosphatase: 112 IU/L (ref 44–121)
BUN/Creatinine Ratio: 14 (ref 9–23)
BUN: 11 mg/dL (ref 6–24)
Bilirubin Total: 0.4 mg/dL (ref 0.0–1.2)
CO2: 20 mmol/L (ref 20–29)
Calcium: 9 mg/dL (ref 8.7–10.2)
Chloride: 104 mmol/L (ref 96–106)
Creatinine, Ser: 0.81 mg/dL (ref 0.57–1.00)
Globulin, Total: 2.4 g/dL (ref 1.5–4.5)
Glucose: 86 mg/dL (ref 70–99)
Potassium: 4.2 mmol/L (ref 3.5–5.2)
Sodium: 141 mmol/L (ref 134–144)
Total Protein: 6.4 g/dL (ref 6.0–8.5)
eGFR: 89 mL/min/{1.73_m2} (ref >59)

## 2022-09-19 LAB — IRON,TIBC AND FERRITIN PANEL
Ferritin: 6 ng/mL — ABNORMAL LOW (ref 15–150)
Iron Saturation: 8 % — CL (ref 15–55)
Iron: 38 ug/dL (ref 27–159)
Total Iron Binding Capacity: 448 ug/dL (ref 250–450)
UIBC: 410 ug/dL (ref 131–425)

## 2022-09-19 LAB — TSH: TSH: 2.03 u[IU]/mL (ref 0.450–4.500)

## 2022-09-21 MED ORDER — ROPINIROLE HCL 0.5 MG PO TABS: 0.5000 mg | ORAL_TABLET | Freq: Every day | ORAL | 1 refills | Status: DC

## 2022-12-15 ENCOUNTER — Other Ambulatory Visit: Payer: Self-pay | Admitting: Family

## 2022-12-22 DIAGNOSIS — Z1231 Encounter for screening mammogram for malignant neoplasm of breast: Secondary | ICD-10-CM | POA: Diagnosis not present

## 2023-01-12 ENCOUNTER — Encounter: Payer: Self-pay | Admitting: Family

## 2023-01-12 ENCOUNTER — Ambulatory Visit: Payer: BC Managed Care – PPO | Admitting: Family

## 2023-01-12 VITALS — BP 109/72 | HR 60 | Temp 97.3°F | Ht 65.0 in | Wt 182.4 lb

## 2023-01-12 DIAGNOSIS — R5383 Other fatigue: Secondary | ICD-10-CM | POA: Diagnosis not present

## 2023-01-12 DIAGNOSIS — E039 Hypothyroidism, unspecified: Secondary | ICD-10-CM

## 2023-01-12 DIAGNOSIS — F411 Generalized anxiety disorder: Secondary | ICD-10-CM | POA: Diagnosis not present

## 2023-01-12 DIAGNOSIS — D500 Iron deficiency anemia secondary to blood loss (chronic): Secondary | ICD-10-CM | POA: Diagnosis not present

## 2023-01-12 DIAGNOSIS — Z0001 Encounter for general adult medical examination with abnormal findings: Secondary | ICD-10-CM

## 2023-01-12 DIAGNOSIS — Z0000 Encounter for general adult medical examination without abnormal findings: Secondary | ICD-10-CM | POA: Diagnosis not present

## 2023-01-12 DIAGNOSIS — E663 Overweight: Secondary | ICD-10-CM | POA: Diagnosis not present

## 2023-01-12 DIAGNOSIS — G2581 Restless legs syndrome: Secondary | ICD-10-CM | POA: Insufficient documentation

## 2023-01-12 DIAGNOSIS — R6889 Other general symptoms and signs: Secondary | ICD-10-CM | POA: Diagnosis not present

## 2023-01-12 DIAGNOSIS — N921 Excessive and frequent menstruation with irregular cycle: Secondary | ICD-10-CM | POA: Diagnosis not present

## 2023-01-12 DIAGNOSIS — K219 Gastro-esophageal reflux disease without esophagitis: Secondary | ICD-10-CM

## 2023-01-12 NOTE — Progress Notes (Signed)
Subjective:    Patient ID: Kimberly Shaw, female    DOB: Sep 29, 1975, 47 y.o.   MRN: [id number]  Chief Complaint  Patient presents with   Medical Management of Chronic Issues    STAYING TIRED A LOT    Pt presents to the office today for CPE without pap. She was seen in the office on 09/18/22 with complaints of menorrhagia since 07/21/22. States she has only had a few weeks since February she has not been bleeding. She has an appointment with GYN 02/05/23.   She is complaining of fatigue and brain fog. She is taking iron daily.   She has RLS and takes Requip 0.5 mg nightly that has greatly improved her symptoms.  Gastroesophageal Reflux She complains of belching and heartburn. This is a chronic problem. The current episode started more than 1 year ago. The problem occurs occasionally. Associated symptoms include fatigue. Risk factors include obesity. She has tried a PPI for the symptoms. The treatment provided moderate relief.  Thyroid Problem Presents for follow-up visit. Symptoms include anxiety, constipation and fatigue. Patient reports no diarrhea. The symptoms have been stable.  Anxiety Presents for follow-up visit. Symptoms include excessive worry, nervous/anxious behavior and restlessness. Symptoms occur occasionally. The severity of symptoms is moderate.   Her past medical history is significant for anemia.  Constipation This is a chronic problem. The current episode started more than 1 year ago. The problem has been waxing and waning since onset. Her stool frequency is 2 to 3 times per week. Pertinent negatives include no diarrhea. She has tried laxatives for the symptoms. The treatment provided moderate relief.  Anemia Presents for follow-up visit. Symptoms include light-headedness and malaise/fatigue.      Review of Systems  Constitutional:  Positive for fatigue and malaise/fatigue.  Gastrointestinal:  Positive for constipation and heartburn. Negative for diarrhea.   Neurological:  Positive for light-headedness.  Psychiatric/Behavioral:  The patient is nervous/anxious.   All other systems reviewed and are negative.      Objective:   Physical Exam Vitals reviewed.  Constitutional:      General: She is not in acute distress.    Appearance: She is well-developed.  HENT:     Head: Normocephalic and atraumatic.     Right Ear: Tympanic membrane normal.     Left Ear: Tympanic membrane normal.  Eyes:     Pupils: Pupils are equal, round, and reactive to light.  Neck:     Thyroid: No thyromegaly.  Cardiovascular:     Rate and Rhythm: Normal rate and regular rhythm.     Heart sounds: Normal heart sounds. No murmur heard. Pulmonary:     Effort: Pulmonary effort is normal. No respiratory distress.     Breath sounds: Normal breath sounds. No wheezing.  Abdominal:     General: Bowel sounds are normal. There is no distension.     Palpations: Abdomen is soft.     Tenderness: There is no abdominal tenderness.  Musculoskeletal:        General: No tenderness. Normal range of motion.     Cervical back: Normal range of motion and neck supple.  Skin:    General: Skin is warm and dry.  Neurological:     Mental Status: She is alert and oriented to person, place, and time.     Cranial Nerves: No cranial nerve deficit.     Deep Tendon Reflexes: Reflexes are normal and symmetric.  Psychiatric:        Behavior: Behavior normal.  Thought Content: Thought content normal.        Judgment: Judgment normal.     BP 109/72   Pulse 60   Temp (!) 97.3 F (36.3 C) (Temporal)   Ht 5\' 5"  (1.651 m)   Wt 182 lb 6.4 oz (82.7 kg)   SpO2 98%   BMI 30.35 kg/m      Assessment & Plan:   Kimberly Shaw comes in today with chief complaint of Medical Management of Chronic Issues (STAYING TIRED A LOT )   Diagnosis and orders addressed:  1. Annual physical exam - Anemia Profile B - CMP14+EGFR - Lipid panel - TSH - VITAMIN D 25 Hydroxy (Vit-D Deficiency,  Fractures)  2. GAD (generalized anxiety disorder) - CMP14+EGFR  3. Gastroesophageal reflux disease, unspecified whether esophagitis present - CMP14+EGFR  4. Hypothyroidism, unspecified type - CMP14+EGFR - TSH  5. Overweight (BMI 25.0-29.9) - CMP14+EGFR  6. Menorrhagia with irregular cycle - CMP14+EGFR  7. Iron deficiency anemia due to chronic blood loss - Anemia Profile B - CMP14+EGFR  8. RLS (restless legs syndrome) - CMP14+EGFR  9. Other fatigue - Anemia Profile B - CMP14+EGFR - TSH - VITAMIN D 25 Hydroxy (Vit-D Deficiency, Fractures)   Labs pending Given fatigue and heavy bleeding may need referral to Hematologists for iron infusion. Keep follow up with GYN.  Continue iron and iron rich diet.  Health Maintenance reviewed Diet and exercise encouraged  Follow up plan: 6 months    Jannifer Rodney, FNP

## 2023-01-12 NOTE — Patient Instructions (Signed)
Iron Deficiency Anemia, Adult  Iron deficiency anemia is a condition in which the concentration of red blood cells or hemoglobin in the blood is below normal because of too little iron. Hemoglobin is a substance in red blood cells that carries oxygen to the body's tissues. When the concentration of red blood cells or hemoglobin is too low, not enough oxygen reaches these tissues. Iron deficiency anemia is usually long-lasting, and it develops over time. It may or may not cause symptoms. It is a common type of anemia. What are the causes? This condition may be caused by: Not enough iron in the diet. Abnormal absorption in the gut. Blood loss. What increases the risk? You are more likely to develop this condition if you get menstrual periods (menstruate) or are pregnant. What are the signs or symptoms? Symptoms of this condition may include: Pale skin, lips, and nail beds. Weakness, dizziness, and getting tired easily. Shortness of breath when moving or exercising. Cold hands or feet. Mild anemia may not cause any symptoms. How is this diagnosed? This condition is diagnosed based on: Your medical history. A physical exam. Blood tests. How is this treated? This condition is treated by correcting the cause of your iron deficiency. Treatment may involve: Adding iron-rich foods to your diet. Taking iron supplements. If you are pregnant or breastfeeding, you may need to take extra iron because your normal diet usually does not provide the amount of iron that you need. Increasing vitamin C intake. Vitamin C helps your body absorb iron. Your health care provider may recommend that you take iron supplements along with a glass of orange juice or a vitamin C supplement. Medicines to make heavy menstrual flow lighter. Surgery or additional testing procedures to determine the cause of your anemia. You may need repeat blood tests to determine whether treatment is working. If the treatment does not  seem to be working, you may need more tests. Follow these instructions at home: Medicines Take over-the-counter and prescription medicines only as told by your health care provider. This includes iron supplements and vitamins. This is important because too much iron can be harmful. For the best iron absorption, you should take iron supplements when your stomach is empty. If you cannot tolerate them on an empty stomach, you may need to take them with food. Do not drink milk or take antacids at the same time as your iron supplements. Milk and antacids may interfere with how your body absorbs iron. Iron supplements may turn stool (feces) a darker color and it may appear black. If you cannot tolerate taking iron supplements by mouth, talk with your health care provider about taking them through an IV or through an injection into a muscle. Eating and drinking Talk with your health care provider before changing your diet. Your provider may recommend that you eat foods that contain a lot of iron, such as: Liver. Low-fat (lean) beef. Breads and cereals that have iron added to them (are fortified). Eggs. Dried fruit. Dark green, leafy vegetables. To help your body use the iron from iron-rich foods, eat those foods at the same time as fresh fruits and vegetables that are high in vitamin C. Foods that are high in vitamin C include: Oranges. Peppers. Tomatoes. Mangoes. Managing constipation If you are taking an iron supplement, it may cause constipation. To prevent or treat constipation, you may need to: Drink enough fluid to keep your urine pale yellow. Take over-the-counter or prescription medicines. Eat foods that are high in fiber, such   as beans, whole grains, and fresh fruits and vegetables. Limit foods that are high in fat and processed sugars, such as fried or sweet foods. General instructions Return to your normal activities as told by your health care provider. Ask your health care provider  what activities are safe for you. Keep all follow-up visits. Contact a health care provider if: You feel nauseous or you vomit. You feel weak. You become light-headed when getting up from a sitting or lying down position. You have unexplained sweating. You develop symptoms of constipation. You have a heaviness in your chest. You have trouble breathing with physical activity. Get help right away if: You faint. If this happens, do not drive yourself to the hospital. You have an irregular or rapid heartbeat. Summary Iron deficiency anemia is a condition in which the concentration of red blood cells or hemoglobin in the blood is below normal because of too little iron. This condition is treated by correcting the cause of your iron deficiency. Take over-the-counter and prescription medicines only as told by your health care provider. This includes iron supplements and vitamins. To help your body use the iron from iron-rich foods, eat those foods at the same time as fresh fruits and vegetables that are high in vitamin C. Seek medical help if you have signs or symptoms of worsening anemia. This information is not intended to replace advice given to you by your health care provider. Make sure you discuss any questions you have with your health care provider. Document Revised: 06/18/2021 Document Reviewed: 06/18/2021 Elsevier Patient Education  2024 [employer]  

## 2023-01-13 LAB — ANEMIA PROFILE B
Basophils Absolute: 0 10*3/uL (ref 0.0–0.2)
Basos: 1 %
EOS (ABSOLUTE): 0.1 10*3/uL (ref 0.0–0.4)
Eos: 2 %
Ferritin: 26 ng/mL (ref 15–150)
Folate: 4.2 ng/mL (ref >3.0)
Hematocrit: 33.6 % — ABNORMAL LOW (ref 34.0–46.6)
Hemoglobin: 11.1 g/dL (ref 11.1–15.9)
Immature Grans (Abs): 0 10*3/uL (ref 0.0–0.1)
Immature Granulocytes: 0 %
Iron Saturation: 16 % (ref 15–55)
Iron: 58 ug/dL (ref 27–159)
Lymphocytes Absolute: 1.9 10*3/uL (ref 0.7–3.1)
Lymphs: 30 %
MCH: 29.5 pg (ref 26.6–33.0)
MCHC: 33 g/dL (ref 31.5–35.7)
MCV: 89 fL (ref 79–97)
Monocytes Absolute: 0.3 10*3/uL (ref 0.1–0.9)
Monocytes: 5 %
Neutrophils Absolute: 3.9 10*3/uL (ref 1.4–7.0)
Neutrophils: 62 %
Platelets: 242 10*3/uL (ref 150–450)
RBC: 3.76 x10E6/uL — ABNORMAL LOW (ref 3.77–5.28)
RDW: 13.6 % (ref 11.7–15.4)
Retic Ct Pct: 2.3 % (ref 0.6–2.6)
Total Iron Binding Capacity: 373 ug/dL (ref 250–450)
UIBC: 315 ug/dL (ref 131–425)
Vitamin B-12: 528 pg/mL (ref 232–1245)
WBC: 6.3 10*3/uL (ref 3.4–10.8)

## 2023-01-13 LAB — CMP14+EGFR
ALT: 16 IU/L (ref 0–32)
AST: 15 IU/L (ref 0–40)
Albumin: 3.7 g/dL — ABNORMAL LOW (ref 3.9–4.9)
Alkaline Phosphatase: 94 IU/L (ref 44–121)
BUN/Creatinine Ratio: 13 (ref 9–23)
BUN: 10 mg/dL (ref 6–24)
Bilirubin Total: 0.4 mg/dL (ref 0.0–1.2)
CO2: 22 mmol/L (ref 20–29)
Calcium: 8.6 mg/dL — ABNORMAL LOW (ref 8.7–10.2)
Chloride: 105 mmol/L (ref 96–106)
Creatinine, Ser: 0.77 mg/dL (ref 0.57–1.00)
Globulin, Total: 2.5 g/dL (ref 1.5–4.5)
Glucose: 96 mg/dL (ref 70–99)
Potassium: 4.2 mmol/L (ref 3.5–5.2)
Sodium: 139 mmol/L (ref 134–144)
Total Protein: 6.2 g/dL (ref 6.0–8.5)
eGFR: 95 mL/min/{1.73_m2} (ref >59)

## 2023-01-13 LAB — LIPID PANEL
Chol/HDL Ratio: 2.9 ratio (ref 0.0–4.4)
Cholesterol, Total: 167 mg/dL (ref 100–199)
HDL: 57 mg/dL (ref >39)
LDL Chol Calc (NIH): 88 mg/dL (ref 0–99)
Triglycerides: 123 mg/dL (ref 0–149)
VLDL Cholesterol Cal: 22 mg/dL (ref 5–40)

## 2023-01-13 LAB — VITAMIN D 25 HYDROXY (VIT D DEFICIENCY, FRACTURES): Vit D, 25-Hydroxy: 16.7 ng/mL — ABNORMAL LOW (ref 30.0–100.0)

## 2023-01-13 LAB — TSH: TSH: 2.79 u[IU]/mL (ref 0.450–4.500)

## 2023-01-14 ENCOUNTER — Other Ambulatory Visit: Payer: Self-pay | Admitting: Family

## 2023-01-14 MED ORDER — VITAMIN D (ERGOCALCIFEROL) 1.25 MG (50000 UNIT) PO CAPS: 50000.0000 [IU] | ORAL_CAPSULE | ORAL | 3 refills | Status: AC

## 2023-01-29 ENCOUNTER — Other Ambulatory Visit: Payer: Self-pay | Admitting: Family

## 2023-01-29 DIAGNOSIS — R4586 Emotional lability: Secondary | ICD-10-CM

## 2023-01-29 DIAGNOSIS — F411 Generalized anxiety disorder: Secondary | ICD-10-CM

## 2023-02-05 DIAGNOSIS — Z01419 Encounter for gynecological examination (general) (routine) without abnormal findings: Secondary | ICD-10-CM | POA: Diagnosis not present

## 2023-02-05 DIAGNOSIS — N924 Excessive bleeding in the premenopausal period: Secondary | ICD-10-CM | POA: Diagnosis not present

## 2023-02-05 DIAGNOSIS — N926 Irregular menstruation, unspecified: Secondary | ICD-10-CM | POA: Diagnosis not present

## 2023-02-05 DIAGNOSIS — Z6830 Body mass index (BMI) 30.0-30.9, adult: Secondary | ICD-10-CM | POA: Diagnosis not present

## 2023-02-18 DIAGNOSIS — N939 Abnormal uterine and vaginal bleeding, unspecified: Secondary | ICD-10-CM | POA: Diagnosis not present

## 2023-02-18 DIAGNOSIS — D259 Leiomyoma of uterus, unspecified: Secondary | ICD-10-CM | POA: Diagnosis not present

## 2023-02-18 DIAGNOSIS — N924 Excessive bleeding in the premenopausal period: Secondary | ICD-10-CM | POA: Diagnosis not present

## 2023-02-18 DIAGNOSIS — N83201 Unspecified ovarian cyst, right side: Secondary | ICD-10-CM | POA: Diagnosis not present

## 2023-02-19 ENCOUNTER — Ambulatory Visit: Payer: BC Managed Care – PPO | Admitting: Family

## 2023-03-04 ENCOUNTER — Other Ambulatory Visit: Payer: Self-pay | Admitting: Family

## 2023-03-22 ENCOUNTER — Encounter: Payer: Self-pay | Admitting: Family

## 2023-03-22 ENCOUNTER — Telehealth: Payer: BC Managed Care – PPO | Admitting: Family

## 2023-03-22 DIAGNOSIS — N921 Excessive and frequent menstruation with irregular cycle: Secondary | ICD-10-CM

## 2023-03-22 NOTE — Patient Instructions (Signed)
Menorrhagia Menorrhagia is a form of abnormal uterine bleeding in which menstrual periods are heavy or last longer than normal. With menorrhagia, the periods may cause enough blood loss and cramping that a woman becomes unable to take part in her usual activities. What are the causes? Common causes of this condition include: Polyps or fibroids. These are noncancerous growths in the uterus. An imbalance of the hormones estrogen and progesterone. Anovulation, which occurs when one of the ovaries does not release an egg during one or more months. A problem with the thyroid gland (hypothyroidism). Side effects of having an intrauterine device (IUD). Side effects of some medicines, such as NSAIDs or blood thinners. A bleeding disorder that stops the blood from clotting normally. In some cases, the cause of this condition is not known. What increases the risk? You are more likely to develop this condition if you have cancer of the uterus. What are the signs or symptoms? Symptoms of this condition include: Routinely having to change your pad or tampon every 1-2 hours because it is soaked. Needing to use pads and tampons at the same time because of heavy bleeding. Needing to wake up to change your pads or tampons during the night. Passing blood clots larger than 1 inch (2.5 cm) in size. Having bleeding that lasts for more than 7 days. Having symptoms of low iron levels (anemia), such as tiredness (fatigue) or shortness of breath. How is this diagnosed? This condition may be diagnosed based on: A physical exam. Your symptoms and menstrual history. Tests, such as: Blood tests to check if you are pregnant or if you have hormonal changes, a bleeding or thyroid disorder, anemia, or other problems. Pap test to check for cancerous changes, infections, or inflammation. Endometrial biopsy. This test involves removing a tissue sample from the lining of the uterus (endometrium) to be examined under a  microscope. Pelvic ultrasound. This test uses sound waves to create images of your uterus, ovaries, and vagina. The images can show if you have fibroids or other growths. Hysteroscopy. For this test, a thin, flexible tube with a light on the end (hysteroscope) is used to look inside your uterus. How is this treated? Treatment may not be needed for this condition. If it is needed, the best treatment for you will depend on: Whether you need to prevent pregnancy. Your desire to have children in the future. The cause and severity of your bleeding. Your personal preference. Medicine Medicines are the first step in treatment. You may be treated with: Hormonal birth control methods. These treatments reduce bleeding during your menstrual period. They include: Birth control pills. Skin patch. Vaginal ring. Shots (injections) that you get every 3 months. Hormonal IUD. Implants that go under the skin. Medicines that thicken the blood and slow bleeding. Medicines that reduce swelling, such as ibuprofen. Medicines that contain an artificial (synthetic) hormone called progestin. Medicines that make the ovaries stop working for a short time. Iron supplements to treat anemia.  Surgery If medicines do not work, surgery may be done. Surgical options may include: Dilation and curettage (D&C). In this procedure, your health care provider opens the lowest part of the uterus (cervix) and then scrapes or suctions tissue from the endometrium. This reduces menstrual bleeding. Operative hysteroscopy. In this procedure, a hysteroscope is used to view your uterus and help remove polyps that may be causing heavy periods. Endometrial ablation. This is when various techniques are used to permanently destroy your entire endometrium. After endometrial ablation, most women have little   or no menstrual flow. This procedure reduces your ability to become pregnant. Endometrial resection. In this procedure, an electrosurgical  wire loop is used to remove the endometrium. This procedure reduces your ability to become pregnant. Hysterectomy. This is surgical removal of your uterus. This is a permanent procedure that stops menstrual periods. Pregnancy is not possible after a hysterectomy. Follow these instructions at home: Medicines Take over-the-counter and prescription medicines only as told by your health care provider. This includes iron pills. Do not change or switch medicines without asking your health care provider. Do not take aspirin or medicines that contain aspirin 1 week before or during your menstrual period. Aspirin may make bleeding worse. Managing constipation Your iron pills may cause constipation. If you are taking prescription iron supplements, you may need to take these actions to prevent or treat constipation: Drink enough fluid to keep your urine pale yellow. Take over-the-counter or prescription medicines. Eat foods that are high in fiber, such as beans, whole grains, and fresh fruits and vegetables. Limit foods that are high in fat and processed sugars, such as fried or sweet foods. General instructions If you need to change your sanitary pad or tampon more than once every 2 hours, limit your activity until the bleeding stops. Eat well-balanced meals, including foods that are high in iron. Foods that have a lot of iron include leafy green vegetables, meat, liver, eggs, and whole-grain breads and cereals. Do not try to lose weight until the abnormal bleeding has stopped and your blood iron level is back to normal. If you need to lose weight, work with your health care provider to lose weight safely. Keep all follow-up visits. This is important. Contact a health care provider if: You soak through a pad or tampon every 1 or 2 hours, and this happens every time you have a period. You need to use pads and tampons at the same time because you are bleeding so much. You have nausea, vomiting, diarrhea, or  other problems related to medicines you are taking. Get help right away if: You soak through more than a pad or tampon in 1 hour. You pass clots bigger than 1 inch (2.5 cm) wide. You feel short of breath. You feel like your heart is beating too fast. You feel dizzy or you faint. You feel very weak or tired. Summary Menorrhagia is a form of abnormal uterine bleeding in which menstrual periods are heavy or last longer than normal. Treatment may not be needed for this condition. If it is needed, it may include medicines or procedures. Take over-the-counter and prescription medicines only as told by your health care provider. This includes iron pills. Get help right away if you have heavy bleeding that soaks through more than a pad or tampon in 1 hour, you pass large clots, or you feel dizzy, short of breath, or very weak or tired. This information is not intended to replace advice given to you by your health care provider. Make sure you discuss any questions you have with your health care provider. Document Revised: 01/23/2020 Document Reviewed: 01/23/2020 Elsevier Patient Education  2024 [employer].

## 2023-03-22 NOTE — Progress Notes (Signed)
Virtual Visit Consent   Kimberly Shaw, you are scheduled for a virtual visit with a  provider today. Just as with appointments in the office, your consent must be obtained to participate. Your consent will be active for this visit and any virtual visit you may have with one of our providers in the next 365 days. If you have a MyChart account, a copy of this consent can be sent to you electronically.  As this is a virtual visit, video technology does not allow for your provider to perform a traditional examination. This may limit your provider's ability to fully assess your condition. If your provider identifies any concerns that need to be evaluated in person or the need to arrange testing (such as labs, EKG, etc.), we will make arrangements to do so. Although advances in technology are sophisticated, we cannot ensure that it will always work on either your end or our end. If the connection with a video visit is poor, the visit may have to be switched to a telephone visit. With either a video or telephone visit, we are not always able to ensure that we have a secure connection.  By engaging in this virtual visit, you consent to the provision of healthcare and authorize for your insurance to be billed (if applicable) for the services provided during this visit. Depending on your insurance coverage, you may receive a charge related to this service.  I need to obtain your verbal consent now. Are you willing to proceed with your visit today? Ameelah Lacina has provided verbal consent on 03/22/2023 for a virtual visit (video or telephone). Jannifer Rodney, FNP  Date: 03/22/2023 11:13 AM  Virtual Visit via Video Note   I, Jannifer Rodney, connected with  Kimberly Shaw  ([id number], [birth year]) on 03/22/23 at 10:40 AM EDT by a video-enabled telemedicine application and verified that I am speaking with the correct person using two identifiers.  Location: Patient: Virtual Visit Location Patient: Other:  work Provider: Pharmacist, community: Office   I discussed the limitations of evaluation and management by telemedicine and the availability of in person appointments. The patient expressed understanding and agreed to proceed.    History of Present Illness: Kimberly Shaw is a 47 y.o. who identifies as a female who was assigned female at birth, and is being seen today to discuss transvaginal US. She is having vaginal bleeding daily with heavy clots.  Her transvaginal US showed, "1. Endometrium measures 15 mm. If patient is considered  premenopausal and if bleeding remains unresponsive to hormonal or  medical therapy, sonohysterogram should be considered for focal  lesion work-up. Alternatively, in the setting of post-menopausal  bleeding, endometrial sampling is indicated to exclude carcinoma. If  results are benign, sonohysterogram should be considered for focal  lesion work-up. (Ref: Radiological Reasoning: Algorithmic Workup of  Abnormal Vaginal Bleeding with Endovaginal Sonography and  Sonohysterography. AJR 2008; 425:Z56-38) (Ref: Radiological  Reasoning: Algorithmic Workup of Abnormal Vaginal Bleeding with  Endovaginal Sonography and Sonohysterography. AJR 2008; [id number]:E33-29).  2. Uterine fibroids.  3. Right ovarian cysts. In a patient of reproductive age, no  follow-up is necessary. In a postmenopausal patient, annual  follow-up is recommended. "  She saw her GYN who told her she could IUD, ablation, or hysterectomy. She is unsure what she wanted to do.  She is worried the IUD will affect her GAD. Reports she has been doing very well on Lexapro 10 mg daily.    HPI: HPI  Problems:  Patient Active  Problem List   Diagnosis Date Noted   Menorrhagia with irregular cycle 01/12/2023   RLS (restless legs syndrome) 01/12/2023   Iron deficiency anemia due to chronic blood loss 01/12/2023   Overweight (BMI 25.0-29.9) 02/20/2022   Mood swing 02/20/2022   GAD (generalized anxiety  disorder) 02/20/2022   GERD (gastroesophageal reflux disease) 04/19/2018   Spinal stenosis, lumbar region, with neurogenic claudication 01/01/2016   Left sciatic nerve pain 05/22/2015   Hypothyroidism 05/22/2015    Allergies:  Allergies  Allergen Reactions   Bee Venom     Swelling    Mobic [Meloxicam] Other (See Comments)    Burning in throat   Medications:  Current Outpatient Medications:    escitalopram (LEXAPRO) 10 MG tablet, TAKE 1 TABLET BY MOUTH DAILY, Disp: 90 tablet, Rfl: 1   ferrous sulfate 324 MG TBEC, Take 324 mg by mouth., Disp: , Rfl:    levothyroxine (SYNTHROID) 88 MCG tablet, TAKE 1 TABLET BY MOUTH DAILY, Disp: 90 tablet, Rfl: 2   omeprazole (PRILOSEC) 40 MG capsule, TAKE 1 CAPSULE BY MOUTH DAILY, Disp: 90 capsule, Rfl: 1   polyethylene glycol (MIRALAX / GLYCOLAX) 17 g packet, Take 17 g by mouth daily., Disp: , Rfl:    rOPINIRole (REQUIP) 0.5 MG tablet, TAKE 1 TABLET BY MOUTH AT BEDTIME, Disp: 90 tablet, Rfl: 0   Vitamin D, Ergocalciferol, (DRISDOL) 1.25 MG (50000 UNIT) CAPS capsule, Take 1 capsule (50,000 Units total) by mouth every 7 (seven) days., Disp: 12 capsule, Rfl: 3  Observations/Objective: Patient is well-developed, well-nourished in no acute distress.  Resting comfortably   Head is normocephalic, atraumatic.  No labored breathing.  Speech is clear and coherent with logical content.  Patient is alert and oriented at baseline.    Assessment and Plan: 1. Menorrhagia with irregular cycle  Discussed her options Recommend starting with IUD, she is worried this will effect her GAD. Discussed she is on low dose Lexapro and we could increase if needed Follow up with GYN  Follow Up Instructions: I discussed the assessment and treatment plan with the patient. The patient was provided an opportunity to ask questions and all were answered. The patient agreed with the plan and demonstrated an understanding of the instructions.  A copy of instructions were sent to  the patient via MyChart unless otherwise noted below.    The patient was advised to call back or seek an in-person evaluation if the symptoms worsen or if the condition fails to improve as anticipated.    Jannifer Rodney, FNP

## 2023-04-20 DIAGNOSIS — Z30430 Encounter for insertion of intrauterine contraceptive device: Secondary | ICD-10-CM | POA: Diagnosis not present

## 2023-05-20 DIAGNOSIS — Z30431 Encounter for routine checking of intrauterine contraceptive device: Secondary | ICD-10-CM | POA: Diagnosis not present

## 2023-06-28 ENCOUNTER — Other Ambulatory Visit: Payer: Self-pay | Admitting: Family

## 2023-06-28 DIAGNOSIS — R4586 Emotional lability: Secondary | ICD-10-CM

## 2023-06-28 DIAGNOSIS — F411 Generalized anxiety disorder: Secondary | ICD-10-CM

## 2023-06-29 NOTE — Telephone Encounter (Signed)
SCHEDULED APPT FOR MARCH 4

## 2023-06-29 NOTE — Telephone Encounter (Signed)
Christy NTBS for 6 mos FU Feb/March RF sent to mail order pharmacy

## 2023-07-01 ENCOUNTER — Ambulatory Visit: Payer: Self-pay | Admitting: Family

## 2023-07-01 NOTE — Telephone Encounter (Signed)
  Chief Complaint: cough Symptoms: productive cough, congestion, body aches Frequency: began yesterday Pertinent Negatives: Patient denies SOB, CP, Fever, NVD Disposition: [] ED /[] Urgent Care (no appt availability in office) / [x] Appointment(In office/virtual)/ []  [facility]/ [] Home Care/ [] Refused Recommended Disposition /[] [facility]/ []  Follow-up with PCP Additional Notes: Patient husband, Dorn on [place], calls reporting the patient is having a productive cough that is constant since yesterday, worsening this morning. Per protocol, patient to be evaluated within 24 hours. No availability with PCP in that timeframe. Patient scheduled with alternate provider in office for 07/02/23 @ 0805. Care advice reviewed, caller verbalized understanding. Alerting PCP for review.   Copied from CRM [id number]. Topic: Clinical - Red Word Triage >> Jul 01, 2023  8:07 AM Bascom RAMAN wrote: Red Word that prompted transfer to Nurse Triage: Thresa, husband,Extremely bad cough, congestion and feeling bad. getting worse. Callback number for Dorn [phone] Reason for Disposition  SEVERE coughing spells (e.g., whooping sound after coughing, vomiting after coughing)  Answer Assessment - Initial Assessment Questions 1. ONSET: When did the cough begin?      Began last night 2. SEVERITY: How bad is the cough today?      Constant 3. SPUTUM: Describe the color of your sputum (none, dry cough; clear, white, yellow, green)     Unsure, caller is husband 4. HEMOPTYSIS: Are you coughing up any blood? If so ask: How much? (flecks, streaks, tablespoons, etc.)     Denies- states wife would have relayed that. 5. DIFFICULTY BREATHING: Are you having difficulty breathing? If Yes, ask: How bad is it? (e.g., mild, moderate, severe)    - MILD: No SOB at rest, mild SOB with walking, speaks normally in sentences, can lie down, no retractions, pulse < 100.    - MODERATE: SOB at rest, SOB with minimal  exertion and prefers to sit, cannot lie down flat, speaks in phrases, mild retractions, audible wheezing, pulse 100-120.    - SEVERE: Very SOB at rest, speaks in single words, struggling to breathe, sitting hunched forward, retractions, pulse > 120      Denies 6. FEVER: Do you have a fever? If Yes, ask: What is your temperature, how was it measured, and when did it start?     Unsure, states he has not checked it. 7. CARDIAC HISTORY: Do you have any history of heart disease? (e.g., heart attack, congestive heart failure)      Denies 8. LUNG HISTORY: Do you have any history of lung disease?  (e.g., pulmonary embolus, asthma, emphysema)     Denies 9. PE RISK FACTORS: Do you have a history of blood clots? (or: recent major surgery, recent prolonged travel, bedridden)     Denies 10. OTHER SYMPTOMS: Do you have any other symptoms? (e.g., runny nose, wheezing, chest pain)       Stuffy nose, body aches 11. PREGNANCY: Is there any chance you are pregnant? When was your last menstrual period?       Denies 12. TRAVEL: Have you traveled out of the country in the last month? (e.g., travel history, exposures)       Denies  Protocols used: Cough - Acute Productive-A-AH

## 2023-07-02 ENCOUNTER — Encounter: Payer: Self-pay | Admitting: Family Medicine

## 2023-07-02 ENCOUNTER — Ambulatory Visit: Payer: BC Managed Care – PPO | Admitting: Family Medicine

## 2023-07-02 DIAGNOSIS — R051 Acute cough: Secondary | ICD-10-CM | POA: Diagnosis not present

## 2023-07-02 DIAGNOSIS — J029 Acute pharyngitis, unspecified: Secondary | ICD-10-CM

## 2023-07-02 LAB — RSV AG, IMMUNOCHR, WAIVED: RSV Ag, Immunochr, Waived: NEGATIVE

## 2023-07-02 LAB — VERITOR FLU A/B WAIVED
Influenza A: NEGATIVE
Influenza B: NEGATIVE

## 2023-07-02 LAB — RAPID STREP SCREEN (MED CTR MEBANE ONLY): Strep Gp A Ag, IA W/Reflex: NEGATIVE

## 2023-07-02 LAB — CULTURE, GROUP A STREP

## 2023-07-02 MED ORDER — PROMETHAZINE-DM 6.25-15 MG/5ML PO SYRP: 5.0000 mL | ORAL_SOLUTION | Freq: Four times a day (QID) | ORAL | 0 refills | Status: DC | PRN

## 2023-07-02 MED ORDER — BENZONATATE 100 MG PO CAPS: 100.0000 mg | ORAL_CAPSULE | Freq: Three times a day (TID) | ORAL | 0 refills | Status: DC | PRN

## 2023-07-02 NOTE — Patient Instructions (Addendum)
 I sent in tessalon  perles and promethazine -Dextromethorphan (cough syrup). Watch OTC medications that also have dextromethorphan in them. You can continue to take tylenol  as needed as well as plain Mucinex (Not mucinex -DM)     It appears that you have a viral upper respiratory infection (cold).  Cold symptoms can last up to 2 weeks.    - Get plenty of rest and drink plenty of fluids. - Try to breathe moist air. Use a cold mist humidifier. - Consume warm fluids (soup or tea) to provide relief for a stuffy nose and to loosen phlegm. - For nasal stuffiness, try saline nasal spray or a Neti Pot. Afrin nasal spray can also be used but this product should not be used longer than 3 days or it will cause rebound nasal stuffiness (worsening nasal congestion). - For sore throat pain relief: use chloraseptic spray, suck on throat lozenges, hard candy or popsicles; gargle with warm salt water (1/4 tsp. salt per 8 oz. of water); and eat soft, bland foods. - Eat a well-balanced diet. If you cannot, ensure you are getting enough nutrients by taking a daily multivitamin. - Avoid dairy products, as they can thicken phlegm. - Avoid alcohol, as it impairs your body's immune system.  CONTACT YOUR DOCTOR IF YOU EXPERIENCE ANY OF THE FOLLOWING: - High fever - Ear pain - Sinus-type headache - Unusually severe cold symptoms - Cough that gets worse while other cold symptoms improve - Flare up of any chronic lung problem, such as asthma - Your symptoms persist longer than 2 weeks

## 2023-07-02 NOTE — Progress Notes (Signed)
 Subjective:  Patient ID: Kimberly Shaw, female    DOB: 1976-05-05, 48 y.o.   MRN: [id number]  Patient Care Team: Lavell Bari LABOR, FNP as PCP - General (Family Medicine) Center, [facility] (Obstetrics and Gynecology)   Chief Complaint:  cough  HPI: Kimberly Shaw is a 48 y.o. female presenting on 07/02/2023 for cough  States that it started two nights ago. States that symptoms are worse at night. Reports that she feels tired, body aches, ear soreness, cough, scratchy throat, rhinorrhea. States multiple people are sick. No testing at home. She has tried tylenol  sinus. Denies fever. Endorses vomiting. States that she will cough and then vomit. Denies nausea.   Relevant past medical, surgical, family, and social history reviewed and updated as indicated.  Allergies and medications reviewed and updated. Data reviewed: Chart in Epic.   Past Medical History:  Diagnosis Date   GERD (gastroesophageal reflux disease)    Hypothyroidism    Migraines    Thyroid  disease     Past Surgical History:  Procedure Laterality Date   BREAST ENHANCEMENT SURGERY     EYE SURGERY     HEMI-MICRODISCECTOMY LUMBAR LAMINECTOMY LEVEL 1 Left 01/01/2016   Procedure: CENTRAL DECOMPRESSION L5,S1 FOR STENOSIS, FORAMINOTOMY S1 NERVE ROOT ON LEFT, MICRODISCECTOMY L5,S1;  Surgeon: Tanda Heading, MD;  Location: [facility];  Service: Orthopedics;  Laterality: Left;   tummy tuck surgery       Social History   Socioeconomic History   Marital status: Married    Spouse name: Not on file   Number of children: Not on file   Years of education: Not on file   Highest education level: Associate degree: academic program  Occupational History   Not on file  Tobacco Use   Smoking status: Never   Smokeless tobacco: Never  Substance and Sexual Activity   Alcohol use: Yes    Comment: rare   Drug use: No   Sexual activity: Not on file  Other Topics Concern   Not on file  Social History Narrative   Not on file   Social  Drivers of Health   Financial Resource Strain: Low Risk  (07/01/2023)   Overall Financial Resource Strain (CARDIA)    Difficulty of Paying Living Expenses: Not hard at all  Food Insecurity: No Food Insecurity (07/01/2023)   Hunger Vital Sign    Worried About Running Out of Food in the Last Year: Never true    Ran Out of Food in the Last Year: Never true  Transportation Needs: No Transportation Needs (07/01/2023)   PRAPARE - Administrator, Civil Service (Medical): No    Lack of Transportation (Non-Medical): No  Physical Activity: Unknown (07/01/2023)   Exercise Vital Sign    Days of Exercise per Week: 0 days    Minutes of Exercise per Session: Not on file  Stress: No Stress Concern Present (07/01/2023)   [employer] of Occupational Health - Occupational Stress Questionnaire    Feeling of Stress : Only a little  Social Connections: Moderately Isolated (07/01/2023)   Social Connection and Isolation Panel [NHANES]    Frequency of Communication with Friends and Family: More than three times a week    Frequency of Social Gatherings with Friends and Family: Once a week    Attends Religious Services: Never    Database Administrator or Organizations: No    Attends Engineer, Structural: Not on file    Marital Status: Married  Catering Manager Violence:  Not At Risk (05/20/2023)   Received from [facility]   Humiliation, Afraid, Rape, and Kick questionnaire    Fear of Current or Ex-Partner: No    Emotionally Abused: No    Physically Abused: No    Sexually Abused: No    Outpatient Encounter Medications as of 07/02/2023  Medication Sig   escitalopram  (LEXAPRO ) 10 MG tablet TAKE 1 TABLET BY MOUTH DAILY   ferrous sulfate 324 MG TBEC Take 324 mg by mouth.   levothyroxine  (SYNTHROID ) 88 MCG tablet TAKE 1 TABLET BY MOUTH DAILY   MIRENA , 52 MG, 20 MCG/DAY IUD SMARTSIG:Vaginal   omeprazole  (PRILOSEC) 40 MG capsule TAKE 1 CAPSULE BY MOUTH DAILY   polyethylene glycol (MIRALAX  /  GLYCOLAX ) 17 g packet Take 17 g by mouth daily.   rOPINIRole  (REQUIP ) 0.5 MG tablet TAKE 1 TABLET BY MOUTH AT BEDTIME   Vitamin D , Ergocalciferol , (DRISDOL ) 1.25 MG (50000 UNIT) CAPS capsule Take 1 capsule (50,000 Units total) by mouth every 7 (seven) days.   No facility-administered encounter medications on file as of 07/02/2023.    Allergies  Allergen Reactions   Bee Venom     Swelling    Mobic  [Meloxicam ] Other (See Comments)    Burning in throat    Review of Systems As per HPI Objective:  BP 111/72   Pulse 93   Temp 98.5 F (36.9 C)   Ht 5' 5 (1.651 m)   Wt 189 lb (85.7 kg)   SpO2 95%   BMI 31.45 kg/m    Wt Readings from Last 3 Encounters:  07/02/23 189 lb (85.7 kg)  01/12/23 182 lb 6.4 oz (82.7 kg)  09/18/22 175 lb (79.4 kg)    Physical Exam Constitutional:      General: She is awake. She is not in acute distress.    Appearance: Normal appearance. She is well-developed and well-groomed. She is ill-appearing. She is not toxic-appearing or diaphoretic.  HENT:     Right Ear: Tenderness present. No swelling. A middle ear effusion is present. There is no impacted cerumen. No foreign body. No mastoid tenderness. No PE tube. No hemotympanum. Tympanic membrane is injected. Tympanic membrane is not scarred, perforated, erythematous, retracted or bulging.     Left Ear: Tenderness present. No swelling. A middle ear effusion is present. There is no impacted cerumen. No foreign body. No mastoid tenderness. No PE tube. No hemotympanum. Tympanic membrane is injected. Tympanic membrane is not scarred, perforated, erythematous, retracted or bulging.     Nose: Mucosal edema, congestion and rhinorrhea present. No nasal deformity, septal deviation or nasal tenderness. Rhinorrhea is clear.     Right Turbinates: Enlarged and swollen.     Left Turbinates: Enlarged and swollen.     Right Sinus: No maxillary sinus tenderness or frontal sinus tenderness.     Left Sinus: No maxillary sinus  tenderness or frontal sinus tenderness.     Mouth/Throat:     Lips: Pink. No lesions.     Mouth: Mucous membranes are moist. No injury or oral lesions.     Dentition: Normal dentition.     Tongue: No lesions.     Palate: No mass.     Pharynx: Posterior oropharyngeal erythema present. No pharyngeal swelling, oropharyngeal exudate, uvula swelling or postnasal drip.     Tonsils: No tonsillar exudate or tonsillar abscesses.  Cardiovascular:     Rate and Rhythm: Normal rate and regular rhythm.     Pulses: Normal pulses.  Radial pulses are 2+ on the right side and 2+ on the left side.       Posterior tibial pulses are 2+ on the right side and 2+ on the left side.     Heart sounds: Normal heart sounds. No murmur heard.    No gallop.  Pulmonary:     Effort: Pulmonary effort is normal. No respiratory distress.     Breath sounds: Normal breath sounds. No stridor. No wheezing, rhonchi or rales.  Musculoskeletal:     Cervical back: Full passive range of motion without pain and neck supple.     Right lower leg: No edema.     Left lower leg: No edema.  Lymphadenopathy:     Head:     Right side of head: No submental, submandibular, tonsillar, preauricular or posterior auricular adenopathy.     Left side of head: No submental, submandibular, tonsillar, preauricular or posterior auricular adenopathy.     Cervical:     Right cervical: No superficial cervical adenopathy.    Left cervical: No superficial cervical adenopathy.  Skin:    General: Skin is warm.     Capillary Refill: Capillary refill takes less than 2 seconds.  Neurological:     General: No focal deficit present.     Mental Status: She is alert, oriented to person, place, and time and easily aroused. Mental status is at baseline.     GCS: GCS eye subscore is 4. GCS verbal subscore is 5. GCS motor subscore is 6.     Motor: No weakness.  Psychiatric:        Attention and Perception: Attention and perception normal.        Mood  and Affect: Mood and affect normal.        Speech: Speech normal.        Behavior: Behavior normal. Behavior is cooperative.        Thought Content: Thought content normal. Thought content does not include homicidal or suicidal ideation. Thought content does not include homicidal or suicidal plan.        Cognition and Memory: Cognition and memory normal.        Judgment: Judgment normal.     Results for orders placed or performed in visit on 01/12/23  Anemia Profile B   Collection Time: 01/12/23  9:34 AM  Result Value Ref Range   Total Iron Binding Capacity 373 250 - 450 ug/dL   UIBC 684 868 - 574 ug/dL   Iron 58 27 - 840 ug/dL   Iron Saturation 16 15 - 55 %   Ferritin 26 15 - 150 ng/mL   Vitamin B-12 528 232 - 1,245 pg/mL   Folate 4.2 >3.0 ng/mL   WBC 6.3 3.4 - 10.8 x10E3/uL   RBC 3.76 (L) 3.77 - 5.28 x10E6/uL   Hemoglobin 11.1 11.1 - 15.9 g/dL   Hematocrit 66.3 (L) 65.9 - 46.6 %   MCV 89 79 - 97 fL   MCH 29.5 26.6 - 33.0 pg   MCHC 33.0 31.5 - 35.7 g/dL   RDW 86.3 88.2 - 84.5 %   Platelets 242 150 - 450 x10E3/uL   Neutrophils 62 Not Estab. %   Lymphs 30 Not Estab. %   Monocytes 5 Not Estab. %   Eos 2 Not Estab. %   Basos 1 Not Estab. %   Neutrophils Absolute 3.9 1.4 - 7.0 x10E3/uL   Lymphocytes Absolute 1.9 0.7 - 3.1 x10E3/uL   Monocytes Absolute 0.3 0.1 - 0.9 x10E3/uL  EOS (ABSOLUTE) 0.1 0.0 - 0.4 x10E3/uL   Basophils Absolute 0.0 0.0 - 0.2 x10E3/uL   Immature Granulocytes 0 Not Estab. %   Immature Grans (Abs) 0.0 0.0 - 0.1 x10E3/uL   Retic Ct Pct 2.3 0.6 - 2.6 %  CMP14+EGFR   Collection Time: 01/12/23  9:34 AM  Result Value Ref Range   Glucose 96 70 - 99 mg/dL   BUN 10 6 - 24 mg/dL   Creatinine, Ser 9.22 0.57 - 1.00 mg/dL   eGFR 95 >40 fO/fpw/8.26   BUN/Creatinine Ratio 13 9 - 23   Sodium 139 134 - 144 mmol/L   Potassium 4.2 3.5 - 5.2 mmol/L   Chloride 105 96 - 106 mmol/L   CO2 22 20 - 29 mmol/L   Calcium 8.6 (L) 8.7 - 10.2 mg/dL   Total Protein 6.2 6.0 -  8.5 g/dL   Albumin 3.7 (L) 3.9 - 4.9 g/dL   Globulin, Total 2.5 1.5 - 4.5 g/dL   Bilirubin Total 0.4 0.0 - 1.2 mg/dL   Alkaline Phosphatase 94 44 - 121 IU/L   AST 15 0 - 40 IU/L   ALT 16 0 - 32 IU/L  Lipid panel   Collection Time: 01/12/23  9:34 AM  Result Value Ref Range   Cholesterol, Total 167 100 - 199 mg/dL   Triglycerides 876 0 - 149 mg/dL   HDL 57 >60 mg/dL   VLDL Cholesterol Cal 22 5 - 40 mg/dL   LDL Chol Calc (NIH) 88 0 - 99 mg/dL   Chol/HDL Ratio 2.9 0.0 - 4.4 ratio  TSH   Collection Time: 01/12/23  9:34 AM  Result Value Ref Range   TSH 2.790 0.450 - 4.500 uIU/mL  VITAMIN D  25 Hydroxy (Vit-D Deficiency, Fractures)   Collection Time: 01/12/23  9:34 AM  Result Value Ref Range   Vit D, 25-Hydroxy 16.7 (L) 30.0 - 100.0 ng/mL       07/02/2023    8:07 AM 01/12/2023    9:07 AM 09/18/2022   12:22 PM 06/16/2022    9:37 AM 03/06/2022    2:00 PM  Depression screen PHQ 2/9  Decreased Interest 0 0 0 0 0  Down, Depressed, Hopeless 0 0 0 0 0  PHQ - 2 Score 0 0 0 0 0  Altered sleeping 1 2  0 1  Tired, decreased energy 1 3  0 1  Change in appetite 1 0  0 0  Feeling bad or failure about yourself  0 0  0 0  Trouble concentrating 0 1  0 0  Moving slowly or fidgety/restless 0 0  0 0  Suicidal thoughts 0 0  0 0  PHQ-9 Score 3 6  0 2  Difficult doing work/chores Not difficult at all Somewhat difficult  Not difficult at all Not difficult at all       07/02/2023    8:08 AM 01/12/2023    9:08 AM 03/06/2022    2:00 PM 01/16/2022   12:02 PM  GAD 7 : Generalized Anxiety Score  Nervous, Anxious, on Edge 1 0 0 1  Control/stop worrying 0 0 0 0  Worry too much - different things 0 0 0 0  Trouble relaxing 1 0 0 0  Restless 1 0 0 0  Easily annoyed or irritable 1 1 0 1  Afraid - awful might happen 0 0 0 0  Total GAD 7 Score 4 1 0 2  Anxiety Difficulty Not difficult at all Not difficult at all Not  difficult at all Not difficult at all    Pertinent labs & imaging results that were  available during my care of the patient were reviewed by me and considered in my medical decision making.  Assessment & Plan:  Devaeh was seen today for cough.  Diagnoses and all orders for this visit:  Acute cough Negative for flu, RSV, and rapid strep. Discussed with patient that likely viral in etiology. Encouraged her to take a home covid test and notify us  with the results as we do not have rapid tests in office and triple swab would not result until outside of the treatment window. Will send in medications as below for symptom management.  -     Veritor Flu A/B Waived -     RSV Ag, Immunochr, Waived -     promethazine -dextromethorphan (PROMETHAZINE -DM) 6.25-15 MG/5ML syrup; Take 5 mLs by mouth 4 (four) times daily as needed. -     benzonatate  (TESSALON  PERLES) 100 MG capsule; Take 1 capsule (100 mg total) by mouth 3 (three) times daily as needed.  Sore throat As above.  -     Rapid Strep Screen ([facility] ONLY) -     Culture, Group A Strep; Future -     promethazine -dextromethorphan (PROMETHAZINE -DM) 6.25-15 MG/5ML syrup; Take 5 mLs by mouth 4 (four) times daily as needed. -     Culture, Group A Strep  Continue all other maintenance medications.  Follow up plan: Return if symptoms worsen or fail to improve.   Continue healthy lifestyle choices, including diet (rich in fruits, vegetables, and lean proteins, and low in salt and simple carbohydrates) and exercise (at least 30 minutes of moderate physical activity daily).  Written and verbal instructions provided   The above assessment and management plan was discussed with the patient. The patient verbalized understanding of and has agreed to the management plan. Patient is aware to call the clinic if they develop any new symptoms or if symptoms persist or worsen. Patient is aware when to return to the clinic for a follow-up visit. Patient educated on when it is appropriate to go to the emergency department.   Marry Kins, DNP-FNP [facility] [address] [place], [place] [address] [phone]

## 2023-07-05 LAB — CULTURE, GROUP A STREP: Strep A Culture: NEGATIVE

## 2023-07-06 ENCOUNTER — Encounter: Payer: Self-pay | Admitting: Family Medicine

## 2023-07-06 DIAGNOSIS — R051 Acute cough: Secondary | ICD-10-CM

## 2023-07-06 NOTE — Progress Notes (Signed)
All negative. Follow up if symptoms continue.

## 2023-07-07 MED ORDER — BENZONATATE 100 MG PO CAPS: 100.0000 mg | ORAL_CAPSULE | Freq: Three times a day (TID) | ORAL | 0 refills | Status: DC | PRN

## 2023-07-07 MED ORDER — HYDROCODONE BIT-HOMATROP MBR 5-1.5 MG/5ML PO SOLN
5.0000 mL | Freq: Four times a day (QID) | ORAL | 0 refills | Status: DC | PRN
Start: 2023-07-07 — End: 2023-08-27

## 2023-07-07 NOTE — Telephone Encounter (Signed)
Called patient and confirmed that she is not pregnant. Will send in hycodan. Counseled on drowsiness. Patient to follow up if symptoms do not get better or worsen

## 2023-07-09 ENCOUNTER — Ambulatory Visit: Payer: BC Managed Care – PPO | Admitting: Family Medicine

## 2023-07-09 ENCOUNTER — Encounter: Payer: Self-pay | Admitting: Family Medicine

## 2023-07-09 VITALS — BP 112/75 | HR 63 | Ht 65.0 in | Wt 196.0 lb

## 2023-07-09 DIAGNOSIS — R051 Acute cough: Secondary | ICD-10-CM | POA: Diagnosis not present

## 2023-07-09 MED ORDER — ALBUTEROL SULFATE HFA 108 (90 BASE) MCG/ACT IN AERS: 2.0000 | INHALATION_SPRAY | Freq: Four times a day (QID) | RESPIRATORY_TRACT | 0 refills | Status: DC | PRN

## 2023-07-09 NOTE — Progress Notes (Signed)
BP 112/75   Pulse 63   Ht 5\' 5"  (1.651 m)   Wt 196 lb (88.9 kg)   SpO2 96%   BMI 32.62 kg/m    Subjective:   Patient ID: Kimberly Shaw, female    DOB: Nov 09, 1975, 48 y.o.   MRN: [id number]  HPI: Kimberly Shaw is a 48 y.o. female presenting on 07/09/2023 for Cough (? bronchitis)   HPI Cough and congestion. Patient started a week ago with a fever and achiness and chills and sinus congestion but those have improved and she mainly has a cough but because the cough sounded so deep at her workplace they decided to send her to make sure she does not have bronchitis.  She says the cough is been deep.  She says she feels a lot better but just still has that very deep dry cough at times.  She is taking Mucinex DM and has been using the Hycodan cough syrup.  Relevant past medical, surgical, family and social history reviewed and updated as indicated. Interim medical history since our last visit reviewed. Allergies and medications reviewed and updated.  Review of Systems  Constitutional:  Negative for chills and fever.  HENT:  Positive for congestion. Negative for ear discharge, ear pain, postnasal drip, rhinorrhea, sinus pressure, sneezing and sore throat.   Eyes:  Negative for pain, redness and visual disturbance.  Respiratory:  Positive for cough. Negative for chest tightness, shortness of breath and wheezing.   Cardiovascular:  Negative for chest pain and leg swelling.  Genitourinary:  Negative for difficulty urinating and dysuria.  Musculoskeletal:  Negative for back pain and gait problem.  Skin:  Negative for rash.  Neurological:  Negative for light-headedness and headaches.  Psychiatric/Behavioral:  Negative for agitation and behavioral problems.   All other systems reviewed and are negative.   Per HPI unless specifically indicated above   Allergies as of 07/09/2023       Reactions   Bee Venom    Swelling   Mobic [meloxicam] Other (See Comments)   Burning in throat         Medication List        Accurate as of July 09, 2023 11:48 AM. If you have any questions, ask your nurse or doctor.          albuterol 108 (90 Base) MCG/ACT inhaler Commonly known as: VENTOLIN HFA Inhale 2 puffs into the lungs every 6 (six) hours as needed for wheezing or shortness of breath. Started by: Elige Radon Cathlene Gardella   benzonatate 100 MG capsule Commonly known as: Lawyer Take 1 capsule (100 mg total) by mouth 3 (three) times daily as needed.   escitalopram 10 MG tablet Commonly known as: LEXAPRO TAKE 1 TABLET BY MOUTH DAILY   ferrous sulfate 324 MG Tbec Take 324 mg by mouth.   HYDROcodone bit-homatropine 5-1.5 MG/5ML syrup Commonly known as: HYCODAN Take 5 mLs by mouth every 6 (six) hours as needed for cough.   levothyroxine 88 MCG tablet Commonly known as: SYNTHROID TAKE 1 TABLET BY MOUTH DAILY   Mirena (52 MG) 20 MCG/DAY Iud Generic drug: levonorgestrel SMARTSIG:Vaginal   omeprazole 40 MG capsule Commonly known as: PRILOSEC TAKE 1 CAPSULE BY MOUTH DAILY   polyethylene glycol 17 g packet Commonly known as: MIRALAX / GLYCOLAX Take 17 g by mouth daily.   rOPINIRole 0.5 MG tablet Commonly known as: REQUIP TAKE 1 TABLET BY MOUTH AT BEDTIME   Vitamin D (Ergocalciferol) 1.25 MG (50000 UNIT) Caps capsule Commonly  known as: DRISDOL Take 1 capsule (50,000 Units total) by mouth every 7 (seven) days.         Objective:   BP 112/75   Pulse 63   Ht 5\' 5"  (1.651 m)   Wt 196 lb (88.9 kg)   SpO2 96%   BMI 32.62 kg/m   Wt Readings from Last 3 Encounters:  07/09/23 196 lb (88.9 kg)  07/02/23 189 lb (85.7 kg)  01/12/23 182 lb 6.4 oz (82.7 kg)    Physical Exam Vitals reviewed.  Constitutional:      General: She is not in acute distress.    Appearance: She is well-developed. She is not diaphoretic.  HENT:     Right Ear: Ear canal and external ear normal.     Left Ear: Ear canal and external ear normal.     Nose: Mucosal edema present.  No rhinorrhea.     Right Sinus: No maxillary sinus tenderness or frontal sinus tenderness.     Left Sinus: No maxillary sinus tenderness or frontal sinus tenderness.     Mouth/Throat:     Pharynx: Uvula midline. No oropharyngeal exudate or posterior oropharyngeal erythema.     Tonsils: No tonsillar abscesses.  Eyes:     Conjunctiva/sclera: Conjunctivae normal.  Cardiovascular:     Rate and Rhythm: Normal rate and regular rhythm.     Heart sounds: Normal heart sounds. No murmur heard. Pulmonary:     Effort: Pulmonary effort is normal. No respiratory distress.     Breath sounds: Normal breath sounds. No wheezing.  Musculoskeletal:        General: No tenderness. Normal range of motion.  Skin:    General: Skin is warm and dry.     Findings: No rash.  Neurological:     Mental Status: She is alert and oriented to person, place, and time.     Coordination: Coordination normal.  Psychiatric:        Behavior: Behavior normal.       Assessment & Plan:   Problem List Items Addressed This Visit   None Visit Diagnoses       Acute cough    -  Primary   Relevant Medications   albuterol (VENTOLIN HFA) 108 (90 Base) MCG/ACT inhaler     Lungs and everything sounds good, gave her an albuterol inhaler to help with the cough and recommended that she take Benadryl at night and use the Mucinex DM still.  Follow up plan: Return if symptoms worsen or fail to improve.  Counseling provided for all of the vaccine components No orders of the defined types were placed in this encounter.   Arville Care, MD [facility] 07/09/2023, 11:48 AM

## 2023-07-27 ENCOUNTER — Ambulatory Visit: Payer: BC Managed Care – PPO | Admitting: Family

## 2023-08-05 ENCOUNTER — Other Ambulatory Visit: Payer: Self-pay | Admitting: Family

## 2023-08-27 ENCOUNTER — Ambulatory Visit: Admitting: Family

## 2023-08-27 ENCOUNTER — Encounter: Payer: Self-pay | Admitting: Family

## 2023-08-27 VITALS — BP 112/76 | HR 59 | Temp 97.4°F | Ht 65.0 in | Wt 191.0 lb

## 2023-08-27 DIAGNOSIS — N921 Excessive and frequent menstruation with irregular cycle: Secondary | ICD-10-CM | POA: Diagnosis not present

## 2023-08-27 DIAGNOSIS — F411 Generalized anxiety disorder: Secondary | ICD-10-CM | POA: Diagnosis not present

## 2023-08-27 DIAGNOSIS — Z713 Dietary counseling and surveillance: Secondary | ICD-10-CM

## 2023-08-27 DIAGNOSIS — K219 Gastro-esophageal reflux disease without esophagitis: Secondary | ICD-10-CM | POA: Diagnosis not present

## 2023-08-27 DIAGNOSIS — R4586 Emotional lability: Secondary | ICD-10-CM | POA: Diagnosis not present

## 2023-08-27 DIAGNOSIS — Z6831 Body mass index (BMI) 31.0-31.9, adult: Secondary | ICD-10-CM

## 2023-08-27 DIAGNOSIS — E6609 Other obesity due to excess calories: Secondary | ICD-10-CM

## 2023-08-27 DIAGNOSIS — E039 Hypothyroidism, unspecified: Secondary | ICD-10-CM

## 2023-08-27 DIAGNOSIS — E66811 Obesity, class 1: Secondary | ICD-10-CM

## 2023-08-27 DIAGNOSIS — D500 Iron deficiency anemia secondary to blood loss (chronic): Secondary | ICD-10-CM | POA: Diagnosis not present

## 2023-08-27 DIAGNOSIS — R6889 Other general symptoms and signs: Secondary | ICD-10-CM | POA: Diagnosis not present

## 2023-08-27 MED ORDER — ESCITALOPRAM OXALATE 10 MG PO TABS: 10.0000 mg | ORAL_TABLET | Freq: Every day | ORAL | 4 refills | Status: AC

## 2023-08-27 MED ORDER — PHENTERMINE HCL 37.5 MG PO TABS: 37.5000 mg | ORAL_TABLET | Freq: Every day | ORAL | 2 refills | Status: DC

## 2023-08-27 NOTE — Patient Instructions (Signed)

## 2023-08-27 NOTE — Progress Notes (Signed)
 Subjective:    Patient ID: Kimberly Shaw, female    DOB: 1976-02-27, 48 y.o.   MRN: [id number]  Chief Complaint  Patient presents with   Medical Management of Chronic Issues   Pt presents to the office today for chronic follow up.   She is followed by GYN for menorrhagia. Had IUD placed and bleeding has improved. She reports she has gained 9 lbs. Requesting to start weight loss medication today.  Gastroesophageal Reflux She complains of belching and heartburn. This is a chronic problem. The current episode started more than 1 year ago. The problem occurs occasionally. Associated symptoms include fatigue. Risk factors include obesity. She has tried a PPI for the symptoms. The treatment provided moderate relief.  Thyroid Problem Presents for follow-up visit. Symptoms include anxiety, constipation and fatigue. Patient reports no diarrhea. The symptoms have been stable.  Anxiety Presents for follow-up visit. Symptoms include excessive worry, nervous/anxious behavior and restlessness. Symptoms occur occasionally. The severity of symptoms is moderate.    Constipation This is a chronic problem. The current episode started more than 1 year ago. The problem has been waxing and waning since onset. Her stool frequency is 2 to 3 times per week. Pertinent negatives include no diarrhea. She has tried diet changes for the symptoms. The treatment provided moderate relief.  Anemia Presents for follow-up visit. There has been no light-headedness or malaise/fatigue.      Review of Systems  Constitutional:  Positive for fatigue. Negative for malaise/fatigue.  Gastrointestinal:  Positive for constipation and heartburn. Negative for diarrhea.  Neurological:  Negative for light-headedness.  Psychiatric/Behavioral:  The patient is nervous/anxious.   All other systems reviewed and are negative.      Objective:   Physical Exam Vitals reviewed.  Constitutional:      General: She is not in acute  distress.    Appearance: She is well-developed. She is obese.  HENT:     Head: Normocephalic and atraumatic.     Right Ear: Tympanic membrane normal.     Left Ear: Tympanic membrane normal.  Eyes:     Pupils: Pupils are equal, round, and reactive to light.  Neck:     Thyroid: No thyromegaly.  Cardiovascular:     Rate and Rhythm: Normal rate and regular rhythm.     Heart sounds: Normal heart sounds. No murmur heard. Pulmonary:     Effort: Pulmonary effort is normal. No respiratory distress.     Breath sounds: Normal breath sounds. No wheezing.  Abdominal:     General: Bowel sounds are normal. There is no distension.     Palpations: Abdomen is soft.     Tenderness: There is no abdominal tenderness.  Musculoskeletal:        General: No tenderness. Normal range of motion.     Cervical back: Normal range of motion and neck supple.  Skin:    General: Skin is warm and dry.  Neurological:     Mental Status: She is alert and oriented to person, place, and time.     Cranial Nerves: No cranial nerve deficit.     Deep Tendon Reflexes: Reflexes are normal and symmetric.  Psychiatric:        Behavior: Behavior normal.        Thought Content: Thought content normal.        Judgment: Judgment normal.     BP 112/76   Pulse (!) 59   Temp (!) 97.4 F (36.3 C) (Temporal)   Ht 5\' 5"  (1.651  m)   Wt 191 lb (86.6 kg)   BMI 31.78 kg/m      Assessment & Plan:   Kimberly Shaw comes in today with chief complaint of Medical Management of Chronic Issues   Diagnosis and orders addressed:  1. GAD (generalized anxiety disorder) - escitalopram (LEXAPRO) 10 MG tablet; Take 1 tablet (10 mg total) by mouth daily.  Dispense: 90 tablet; Refill: 4 - CMP14+EGFR  2. Mood swing - escitalopram (LEXAPRO) 10 MG tablet; Take 1 tablet (10 mg total) by mouth daily.  Dispense: 90 tablet; Refill: 4 - CMP14+EGFR  3. Gastroesophageal reflux disease, unspecified whether esophagitis present (Primary) -  CMP14+EGFR  4. Hypothyroidism, unspecified type - CMP14+EGFR  5. Iron deficiency anemia due to chronic blood loss  - Anemia Profile B - CMP14+EGFR  6. Menorrhagia with irregular cycle - CMP14+EGFR  7. Class 1 obesity due to excess calories with serious comorbidity and body mass index (BMI) of 31.0 to 31.9 in adult - CMP14+EGFR - phentermine (ADIPEX-P) 37.5 MG tablet; Take 1 tablet (37.5 mg total) by mouth daily before breakfast.  Dispense: 30 tablet; Refill: 2  8. Weight loss counseling, encounter for - CMP14+EGFR - phentermine (ADIPEX-P) 37.5 MG tablet; Take 1 tablet (37.5 mg total) by mouth daily before breakfast.  Dispense: 30 tablet; Refill: 2    Labs pending Start phentermine today Limit caffeine Continue current medications  Encourage healthy diet and exercise   Health Maintenance reviewed Diet and exercise encouraged  Follow up plan: 3 months    Jannifer Rodney, FNP

## 2023-08-28 LAB — ANEMIA PROFILE B
Basophils Absolute: 0 10*3/uL (ref 0.0–0.2)
Basos: 1 %
EOS (ABSOLUTE): 0.1 10*3/uL (ref 0.0–0.4)
Eos: 1 %
Ferritin: 6 ng/mL — ABNORMAL LOW (ref 15–150)
Folate: 5.7 ng/mL (ref >3.0)
Hematocrit: 34.8 % (ref 34.0–46.6)
Hemoglobin: 10.8 g/dL — ABNORMAL LOW (ref 11.1–15.9)
Immature Grans (Abs): 0 10*3/uL (ref 0.0–0.1)
Immature Granulocytes: 0 %
Iron Saturation: 9 % — CL (ref 15–55)
Iron: 39 ug/dL (ref 27–159)
Lymphocytes Absolute: 2.5 10*3/uL (ref 0.7–3.1)
Lymphs: 35 %
MCH: 26 pg — ABNORMAL LOW (ref 26.6–33.0)
MCHC: 31 g/dL — ABNORMAL LOW (ref 31.5–35.7)
MCV: 84 fL (ref 79–97)
Monocytes Absolute: 0.4 10*3/uL (ref 0.1–0.9)
Monocytes: 6 %
Neutrophils Absolute: 4.1 10*3/uL (ref 1.4–7.0)
Neutrophils: 57 %
Platelets: 241 10*3/uL (ref 150–450)
RBC: 4.16 x10E6/uL (ref 3.77–5.28)
RDW: 14.9 % (ref 11.7–15.4)
Retic Ct Pct: 1.4 % (ref 0.6–2.6)
Total Iron Binding Capacity: 449 ug/dL (ref 250–450)
UIBC: 410 ug/dL (ref 131–425)
Vitamin B-12: 378 pg/mL (ref 232–1245)
WBC: 7.1 10*3/uL (ref 3.4–10.8)

## 2023-08-28 LAB — CMP14+EGFR
ALT: 13 IU/L (ref 0–32)
AST: 13 IU/L (ref 0–40)
Albumin: 4 g/dL (ref 3.9–4.9)
Alkaline Phosphatase: 87 IU/L (ref 44–121)
BUN/Creatinine Ratio: 11 (ref 9–23)
BUN: 9 mg/dL (ref 6–24)
Bilirubin Total: 0.4 mg/dL (ref 0.0–1.2)
CO2: 22 mmol/L (ref 20–29)
Calcium: 8.6 mg/dL — ABNORMAL LOW (ref 8.7–10.2)
Chloride: 104 mmol/L (ref 96–106)
Creatinine, Ser: 0.84 mg/dL (ref 0.57–1.00)
Globulin, Total: 2.4 g/dL (ref 1.5–4.5)
Glucose: 88 mg/dL (ref 70–99)
Potassium: 4.1 mmol/L (ref 3.5–5.2)
Sodium: 139 mmol/L (ref 134–144)
Total Protein: 6.4 g/dL (ref 6.0–8.5)
eGFR: 85 mL/min/{1.73_m2} (ref >59)

## 2023-09-04 ENCOUNTER — Other Ambulatory Visit: Payer: Self-pay | Admitting: Family

## 2023-10-05 ENCOUNTER — Ambulatory Visit: Admitting: Family

## 2023-10-05 ENCOUNTER — Encounter: Payer: Self-pay | Admitting: Family

## 2023-10-05 VITALS — BP 107/73 | HR 79 | Temp 97.8°F | Ht 65.0 in | Wt 187.2 lb

## 2023-10-05 DIAGNOSIS — E039 Hypothyroidism, unspecified: Secondary | ICD-10-CM

## 2023-10-05 DIAGNOSIS — Z713 Dietary counseling and surveillance: Secondary | ICD-10-CM

## 2023-10-05 DIAGNOSIS — D500 Iron deficiency anemia secondary to blood loss (chronic): Secondary | ICD-10-CM

## 2023-10-05 DIAGNOSIS — F411 Generalized anxiety disorder: Secondary | ICD-10-CM | POA: Diagnosis not present

## 2023-10-05 DIAGNOSIS — E6609 Other obesity due to excess calories: Secondary | ICD-10-CM | POA: Diagnosis not present

## 2023-10-05 DIAGNOSIS — K219 Gastro-esophageal reflux disease without esophagitis: Secondary | ICD-10-CM | POA: Diagnosis not present

## 2023-10-05 DIAGNOSIS — E66811 Obesity, class 1: Secondary | ICD-10-CM | POA: Diagnosis not present

## 2023-10-05 DIAGNOSIS — Z6831 Body mass index (BMI) 31.0-31.9, adult: Secondary | ICD-10-CM

## 2023-10-05 MED ORDER — PHENTERMINE HCL 37.5 MG PO TABS: 37.5000 mg | ORAL_TABLET | Freq: Every day | ORAL | 2 refills | Status: DC

## 2023-10-05 NOTE — Patient Instructions (Signed)
Iron Deficiency Anemia, Adult  Iron deficiency anemia is a condition in which the concentration of red blood cells or hemoglobin in the blood is below normal because of too little iron. Hemoglobin is a substance in red blood cells that carries oxygen to the body's tissues. When the concentration of red blood cells or hemoglobin is too low, not enough oxygen reaches these tissues. Iron deficiency anemia is usually long-lasting, and it develops over time. It may or may not cause symptoms. It is a common type of anemia. What are the causes? This condition may be caused by: Not enough iron in the diet. Abnormal absorption in the gut. Blood loss. What increases the risk? You are more likely to develop this condition if you get menstrual periods (menstruate) or are pregnant. What are the signs or symptoms? Symptoms of this condition may include: Pale skin, lips, and nail beds. Weakness, dizziness, and getting tired easily. Shortness of breath when moving or exercising. Cold hands or feet. Mild anemia may not cause any symptoms. How is this diagnosed? This condition is diagnosed based on: Your medical history. A physical exam. Blood tests. How is this treated? This condition is treated by correcting the cause of your iron deficiency. Treatment may involve: Adding iron-rich foods to your diet. Taking iron supplements. If you are pregnant or breastfeeding, you may need to take extra iron because your normal diet usually does not provide the amount of iron that you need. Increasing vitamin C intake. Vitamin C helps your body absorb iron. Your health care provider may recommend that you take iron supplements along with a glass of orange juice or a vitamin C supplement. Medicines to make heavy menstrual flow lighter. Surgery or additional testing procedures to determine the cause of your anemia. You may need repeat blood tests to determine whether treatment is working. If the treatment does not  seem to be working, you may need more tests. Follow these instructions at home: Medicines Take over-the-counter and prescription medicines only as told by your health care provider. This includes iron supplements and vitamins. This is important because too much iron can be harmful. For the best iron absorption, you should take iron supplements when your stomach is empty. If you cannot tolerate them on an empty stomach, you may need to take them with food. Do not drink milk or take antacids at the same time as your iron supplements. Milk and antacids may interfere with how your body absorbs iron. Iron supplements may turn stool (feces) a darker color and it may appear black. If you cannot tolerate taking iron supplements by mouth, talk with your health care provider about taking them through an IV or through an injection into a muscle. Eating and drinking Talk with your health care provider before changing your diet. Your provider may recommend that you eat foods that contain a lot of iron, such as: Liver. Low-fat (lean) beef. Breads and cereals that have iron added to them (are fortified). Eggs. Dried fruit. Dark green, leafy vegetables. To help your body use the iron from iron-rich foods, eat those foods at the same time as fresh fruits and vegetables that are high in vitamin C. Foods that are high in vitamin C include: Oranges. Peppers. Tomatoes. Mangoes. Managing constipation If you are taking an iron supplement, it may cause constipation. To prevent or treat constipation, you may need to: Drink enough fluid to keep your urine pale yellow. Take over-the-counter or prescription medicines. Eat foods that are high in fiber, such   as beans, whole grains, and fresh fruits and vegetables. Limit foods that are high in fat and processed sugars, such as fried or sweet foods. General instructions Return to your normal activities as told by your health care provider. Ask your health care provider  what activities are safe for you. Keep all follow-up visits. Contact a health care provider if: You feel nauseous or you vomit. You feel weak. You become light-headed when getting up from a sitting or lying down position. You have unexplained sweating. You develop symptoms of constipation. You have a heaviness in your chest. You have trouble breathing with physical activity. Get help right away if: You faint. If this happens, do not drive yourself to the hospital. You have an irregular or rapid heartbeat. Summary Iron deficiency anemia is a condition in which the concentration of red blood cells or hemoglobin in the blood is below normal because of too little iron. This condition is treated by correcting the cause of your iron deficiency. Take over-the-counter and prescription medicines only as told by your health care provider. This includes iron supplements and vitamins. To help your body use the iron from iron-rich foods, eat those foods at the same time as fresh fruits and vegetables that are high in vitamin C. Seek medical help if you have signs or symptoms of worsening anemia. This information is not intended to replace advice given to you by your health care provider. Make sure you discuss any questions you have with your health care provider. Document Revised: 06/18/2021 Document Reviewed: 06/18/2021 Elsevier Patient Education  2024 [employer]  

## 2023-10-05 NOTE — Progress Notes (Signed)
 Subjective:    Patient ID: Kimberly Shaw, female    DOB: 08-31-1975, 48 y.o.   MRN: [id number]  Chief Complaint  Patient presents with   Follow-up   Pt presents to the office today for chronic follow up.   She is followed by GYN for menorrhagia. Had IUD placed and bleeding has improved.  She was started on phentermine . She has lost 9 lbs.      10/05/2023   11:12 AM 08/27/2023   11:15 AM 07/09/2023   11:04 AM  Last 3 Weights  Weight (lbs) 187 lb 3.2 oz 191 lb 196 lb  Weight (kg) 84.913 kg 86.637 kg 88.905 kg     Gastroesophageal Reflux She complains of belching and heartburn. This is a chronic problem. The current episode started more than 1 year ago. The problem occurs occasionally. Pertinent negatives include no fatigue. Risk factors include obesity. She has tried a PPI for the symptoms. The treatment provided moderate relief.  Thyroid  Problem Presents for follow-up visit. Symptoms include anxiety and constipation. Patient reports no diarrhea or fatigue. The symptoms have been stable.  Anxiety Presents for follow-up visit. Symptoms include excessive worry, nervous/anxious behavior and restlessness. Symptoms occur occasionally. The severity of symptoms is moderate.    Constipation This is a chronic problem. The current episode started more than 1 year ago. The problem has been waxing and waning since onset. Her stool frequency is 2 to 3 times per week. Pertinent negatives include no diarrhea. She has tried diet changes and laxatives for the symptoms. The treatment provided moderate relief.  Anemia Presents for follow-up visit. There has been no light-headedness or malaise/fatigue.      Review of Systems  Constitutional:  Negative for fatigue and malaise/fatigue.  Gastrointestinal:  Positive for constipation and heartburn. Negative for diarrhea.  Neurological:  Negative for light-headedness.  Psychiatric/Behavioral:  The patient is nervous/anxious.   All other systems reviewed  and are negative.      Objective:   Physical Exam Vitals reviewed.  Constitutional:      General: She is not in acute distress.    Appearance: She is well-developed. She is obese.  HENT:     Head: Normocephalic and atraumatic.     Right Ear: Tympanic membrane normal.     Left Ear: Tympanic membrane normal.  Eyes:     Pupils: Pupils are equal, round, and reactive to light.  Neck:     Thyroid : No thyromegaly.  Cardiovascular:     Rate and Rhythm: Normal rate and regular rhythm.     Heart sounds: Normal heart sounds. No murmur heard. Pulmonary:     Effort: Pulmonary effort is normal. No respiratory distress.     Breath sounds: Normal breath sounds. No wheezing.  Abdominal:     General: Bowel sounds are normal. There is no distension.     Palpations: Abdomen is soft.     Tenderness: There is no abdominal tenderness.  Musculoskeletal:        General: No tenderness. Normal range of motion.     Cervical back: Normal range of motion and neck supple.  Skin:    General: Skin is warm and dry.  Neurological:     Mental Status: She is alert and oriented to person, place, and time.     Cranial Nerves: No cranial nerve deficit.     Deep Tendon Reflexes: Reflexes are normal and symmetric.  Psychiatric:        Behavior: Behavior normal.  Thought Content: Thought content normal.        Judgment: Judgment normal.     BP 107/73   Pulse 79   Temp 97.8 F (36.6 C) (Temporal)   Ht 5\' 5"  (1.651 m)   Wt 187 lb 3.2 oz (84.9 kg)   BMI 31.15 kg/m      Assessment & Plan:   Chelise Drain comes in today with chief complaint of Follow-up   Diagnosis and orders addressed:  1. Hypothyroidism, unspecified type (Primary) - BMP8+EGFR - CBC with Differential/Platelet  2. Gastroesophageal reflux disease, unspecified whether esophagitis present - BMP8+EGFR - CBC with Differential/Platelet  3. Class 1 obesity due to excess calories with serious comorbidity and body mass index (BMI)  of 31.0 to 31.9 in adult - phentermine  (ADIPEX-P ) 37.5 MG tablet; Take 1 tablet (37.5 mg total) by mouth daily before breakfast.  Dispense: 30 tablet; Refill: 2 - BMP8+EGFR - CBC with Differential/Platelet  4. GAD (generalized anxiety disorder) - BMP8+EGFR - CBC with Differential/Platelet  5. Iron deficiency anemia due to chronic blood loss - BMP8+EGFR - Iron, TIBC and Ferritin Panel - CBC with Differential/Platelet  6. Weight loss counseling, encounter for - phentermine  (ADIPEX-P ) 37.5 MG tablet; Take 1 tablet (37.5 mg total) by mouth daily before breakfast.  Dispense: 30 tablet; Refill: 2 - BMP8+EGFR - CBC with Differential/Platelet    Labs pending Phentermine  refilled today Limit caffeine Continue current medications  Encourage healthy diet and exercise   Health Maintenance reviewed Diet and exercise encouraged  Follow up plan: 3 months    Tommas Fragmin, FNP

## 2023-10-06 LAB — IRON,TIBC AND FERRITIN PANEL
Ferritin: 29 ng/mL (ref 15–150)
Iron Saturation: 79 % (ref 15–55)
Iron: 295 ug/dL (ref 27–159)
Total Iron Binding Capacity: 374 ug/dL (ref 250–450)
UIBC: 79 ug/dL — ABNORMAL LOW (ref 131–425)

## 2023-10-06 LAB — CBC WITH DIFFERENTIAL/PLATELET
Basophils Absolute: 0.1 10*3/uL (ref 0.0–0.2)
Basos: 1 %
EOS (ABSOLUTE): 0.2 10*3/uL (ref 0.0–0.4)
Eos: 3 %
Hematocrit: 39.4 % (ref 34.0–46.6)
Hemoglobin: 12.9 g/dL (ref 11.1–15.9)
Immature Grans (Abs): 0 10*3/uL (ref 0.0–0.1)
Immature Granulocytes: 0 %
Lymphocytes Absolute: 2.3 10*3/uL (ref 0.7–3.1)
Lymphs: 37 %
MCH: 28.2 pg (ref 26.6–33.0)
MCHC: 32.7 g/dL (ref 31.5–35.7)
MCV: 86 fL (ref 79–97)
Monocytes Absolute: 0.3 10*3/uL (ref 0.1–0.9)
Monocytes: 4 %
Neutrophils Absolute: 3.4 10*3/uL (ref 1.4–7.0)
Neutrophils: 55 %
Platelets: 250 10*3/uL (ref 150–450)
RBC: 4.57 x10E6/uL (ref 3.77–5.28)
RDW: 16.2 % — ABNORMAL HIGH (ref 11.7–15.4)
WBC: 6.2 10*3/uL (ref 3.4–10.8)

## 2023-10-06 LAB — BMP8+EGFR
BUN/Creatinine Ratio: 12 (ref 9–23)
BUN: 10 mg/dL (ref 6–24)
CO2: 23 mmol/L (ref 20–29)
Calcium: 8.8 mg/dL (ref 8.7–10.2)
Chloride: 106 mmol/L (ref 96–106)
Creatinine, Ser: 0.82 mg/dL (ref 0.57–1.00)
Glucose: 95 mg/dL (ref 70–99)
Potassium: 4.1 mmol/L (ref 3.5–5.2)
Sodium: 140 mmol/L (ref 134–144)
eGFR: 88 mL/min/{1.73_m2} (ref >59)

## 2023-10-07 ENCOUNTER — Ambulatory Visit: Payer: Self-pay | Admitting: Family

## 2023-10-31 ENCOUNTER — Other Ambulatory Visit: Payer: Self-pay | Admitting: Family

## 2023-12-03 ENCOUNTER — Ambulatory Visit: Admitting: Family

## 2023-12-24 DIAGNOSIS — Z1231 Encounter for screening mammogram for malignant neoplasm of breast: Secondary | ICD-10-CM | POA: Diagnosis not present

## 2024-01-06 ENCOUNTER — Ambulatory Visit: Admitting: Family

## 2024-01-19 ENCOUNTER — Other Ambulatory Visit: Payer: Self-pay | Admitting: Family

## 2024-02-08 ENCOUNTER — Encounter: Payer: Self-pay | Admitting: Family

## 2024-02-08 ENCOUNTER — Ambulatory Visit: Admitting: Family

## 2024-02-08 VITALS — BP 121/87 | HR 71 | Temp 97.5°F | Ht 65.0 in | Wt 179.4 lb

## 2024-02-08 DIAGNOSIS — E039 Hypothyroidism, unspecified: Secondary | ICD-10-CM | POA: Diagnosis not present

## 2024-02-08 DIAGNOSIS — K219 Gastro-esophageal reflux disease without esophagitis: Secondary | ICD-10-CM

## 2024-02-08 DIAGNOSIS — Z1159 Encounter for screening for other viral diseases: Secondary | ICD-10-CM

## 2024-02-08 DIAGNOSIS — E663 Overweight: Secondary | ICD-10-CM | POA: Diagnosis not present

## 2024-02-08 DIAGNOSIS — Z713 Dietary counseling and surveillance: Secondary | ICD-10-CM

## 2024-02-08 DIAGNOSIS — D500 Iron deficiency anemia secondary to blood loss (chronic): Secondary | ICD-10-CM

## 2024-02-08 DIAGNOSIS — Z0001 Encounter for general adult medical examination with abnormal findings: Secondary | ICD-10-CM | POA: Diagnosis not present

## 2024-02-08 DIAGNOSIS — F411 Generalized anxiety disorder: Secondary | ICD-10-CM

## 2024-02-08 DIAGNOSIS — Z0000 Encounter for general adult medical examination without abnormal findings: Secondary | ICD-10-CM | POA: Diagnosis not present

## 2024-02-08 MED ORDER — LEVOTHYROXINE SODIUM 88 MCG PO TABS: 88.0000 ug | ORAL_TABLET | Freq: Every day | ORAL | 3 refills | Status: AC

## 2024-02-08 MED ORDER — PHENTERMINE HCL 37.5 MG PO TABS: 37.5000 mg | ORAL_TABLET | Freq: Every day | ORAL | 2 refills | Status: AC

## 2024-02-08 NOTE — Patient Instructions (Signed)

## 2024-02-08 NOTE — Progress Notes (Signed)
 Subjective:    Patient ID: Kimberly Shaw, female    DOB: Jun 04, 1975, 48 y.o.   MRN: [id number]  Chief Complaint  Patient presents with   3 MONTH FOLLOW UP   Pt presents to the office today for CPE without pap.   She is followed by GYN for menorrhagia. Had IUD placed and bleeding has improved.  She was started on phentermine . She has lost 12 lbs. Her starting weight was 191 lb.      02/08/2024    9:42 AM 10/05/2023   11:12 AM 08/27/2023   11:15 AM  Last 3 Weights  Weight (lbs) 179 lb 6.4 oz 187 lb 3.2 oz 191 lb  Weight (kg) 81.375 kg 84.913 kg 86.637 kg     Gastroesophageal Reflux She complains of belching and heartburn. This is a chronic problem. The current episode started more than 1 year ago. The problem occurs rarely. The symptoms are aggravated by certain foods. Pertinent negatives include no fatigue. Risk factors include obesity. She has tried a PPI for the symptoms. The treatment provided moderate relief.  Thyroid  Problem Presents for follow-up visit. Symptoms include anxiety and constipation. Patient reports no diarrhea or fatigue. The symptoms have been stable.  Anxiety Presents for follow-up visit. Symptoms include excessive worry, nervous/anxious behavior and restlessness. Symptoms occur occasionally. The severity of symptoms is moderate.    Constipation This is a chronic problem. The current episode started more than 1 year ago. The problem has been waxing and waning since onset. Her stool frequency is 2 to 3 times per week. Pertinent negatives include no diarrhea. She has tried diet changes and laxatives for the symptoms. The treatment provided moderate relief.  Anemia Presents for follow-up visit. There has been no light-headedness or malaise/fatigue.      Review of Systems  Constitutional:  Negative for fatigue and malaise/fatigue.  Gastrointestinal:  Positive for constipation and heartburn. Negative for diarrhea.  Neurological:  Negative for light-headedness.   Psychiatric/Behavioral:  The patient is nervous/anxious.   All other systems reviewed and are negative.      Objective:   Physical Exam Vitals reviewed.  Constitutional:      General: She is not in acute distress.    Appearance: She is well-developed. She is obese.  HENT:     Head: Normocephalic and atraumatic.     Right Ear: Tympanic membrane normal.     Left Ear: Tympanic membrane normal.  Eyes:     Pupils: Pupils are equal, round, and reactive to light.  Neck:     Thyroid : No thyromegaly.  Cardiovascular:     Rate and Rhythm: Normal rate and regular rhythm.     Heart sounds: Normal heart sounds. No murmur heard. Pulmonary:     Effort: Pulmonary effort is normal. No respiratory distress.     Breath sounds: Normal breath sounds. No wheezing.  Abdominal:     General: Bowel sounds are normal. There is no distension.     Palpations: Abdomen is soft.     Tenderness: There is no abdominal tenderness.  Musculoskeletal:        General: No tenderness. Normal range of motion.     Cervical back: Normal range of motion and neck supple.  Skin:    General: Skin is warm and dry.  Neurological:     Mental Status: She is alert and oriented to person, place, and time.     Cranial Nerves: No cranial nerve deficit.     Deep Tendon Reflexes: Reflexes are normal  and symmetric.  Psychiatric:        Behavior: Behavior normal.        Thought Content: Thought content normal.        Judgment: Judgment normal.     BP 121/87   Pulse 71   Temp (!) 97.5 F (36.4 C)   Ht 5' 5 (1.651 m)   Wt 179 lb 6.4 oz (81.4 kg)   SpO2 98%   BMI 29.85 kg/m      Assessment & Plan:   [name] comes in today with chief complaint of 3 MONTH FOLLOW UP   Diagnosis and orders addressed:  1. Annual physical exam (Primary) - CMP14+EGFR - Lipid panel - TSH - Anemia Profile B - Hepatitis B surface antibody,quantitative  2. Hypothyroidism, unspecified type - CMP14+EGFR - TSH  3.  Gastroesophageal reflux disease, unspecified whether esophagitis present - CMP14+EGFR  4. Iron deficiency anemia due to chronic blood loss  - CMP14+EGFR - Anemia Profile B  5. GAD (generalized anxiety disorder) - CMP14+EGFR  6. Overweight - phentermine  (ADIPEX-P ) 37.5 MG tablet; Take 1 tablet (37.5 mg total) by mouth daily before breakfast.  Dispense: 30 tablet; Refill: 2 - CMP14+EGFR  7. Weight loss counseling, encounter for - phentermine  (ADIPEX-P ) 37.5 MG tablet; Take 1 tablet (37.5 mg total) by mouth daily before breakfast.  Dispense: 30 tablet; Refill: 2 - CMP14+EGFR  8. Need for hepatitis B screening test - CMP14+EGFR - Hepatitis B surface antibody,quantitative    Labs pending Phentermine  refilled today Limit caffeine Continue current medications  Encourage healthy diet and exercise   Health Maintenance reviewed Diet and exercise encouraged  Follow up plan: 3 months    Bari Learn, FNP

## 2024-02-09 LAB — ANEMIA PROFILE B
Basophils Absolute: 0.1 x10E3/uL (ref 0.0–0.2)
Basos: 1 %
EOS (ABSOLUTE): 0.1 x10E3/uL (ref 0.0–0.4)
Eos: 2 %
Ferritin: 22 ng/mL (ref 15–150)
Folate: 3.1 ng/mL (ref >3.0)
Hematocrit: 41.9 % (ref 34.0–46.6)
Hemoglobin: 13.7 g/dL (ref 11.1–15.9)
Immature Grans (Abs): 0 x10E3/uL (ref 0.0–0.1)
Immature Granulocytes: 0 %
Iron Saturation: 18 % (ref 15–55)
Iron: 79 ug/dL (ref 27–159)
Lymphocytes Absolute: 2.1 x10E3/uL (ref 0.7–3.1)
Lymphs: 29 %
MCH: 29.8 pg (ref 26.6–33.0)
MCHC: 32.7 g/dL (ref 31.5–35.7)
MCV: 91 fL (ref 79–97)
Monocytes Absolute: 0.4 x10E3/uL (ref 0.1–0.9)
Monocytes: 6 %
Neutrophils Absolute: 4.5 x10E3/uL (ref 1.4–7.0)
Neutrophils: 62 %
Platelets: 246 x10E3/uL (ref 150–450)
RBC: 4.6 x10E6/uL (ref 3.77–5.28)
RDW: 12.9 % (ref 11.7–15.4)
Retic Ct Pct: 1 % (ref 0.6–2.6)
Total Iron Binding Capacity: 433 ug/dL (ref 250–450)
UIBC: 354 ug/dL (ref 131–425)
Vitamin B-12: 364 pg/mL (ref 232–1245)
WBC: 7.2 x10E3/uL (ref 3.4–10.8)

## 2024-02-09 LAB — TSH: TSH: 1.57 u[IU]/mL (ref 0.450–4.500)

## 2024-02-09 LAB — LIPID PANEL
Chol/HDL Ratio: 4 ratio (ref 0.0–4.4)
Cholesterol, Total: 190 mg/dL (ref 100–199)
HDL: 48 mg/dL (ref >39)
LDL Chol Calc (NIH): 127 mg/dL — ABNORMAL HIGH (ref 0–99)
Triglycerides: 83 mg/dL (ref 0–149)
VLDL Cholesterol Cal: 15 mg/dL (ref 5–40)

## 2024-02-09 LAB — CMP14+EGFR
ALT: 19 IU/L (ref 0–32)
AST: 11 IU/L (ref 0–40)
Albumin: 4.3 g/dL (ref 3.9–4.9)
Alkaline Phosphatase: 121 IU/L — ABNORMAL HIGH (ref 41–116)
BUN/Creatinine Ratio: 10 (ref 9–23)
BUN: 11 mg/dL (ref 6–24)
Bilirubin Total: 0.6 mg/dL (ref 0.0–1.2)
CO2: 23 mmol/L (ref 20–29)
Calcium: 9.1 mg/dL (ref 8.7–10.2)
Chloride: 103 mmol/L (ref 96–106)
Creatinine, Ser: 1.05 mg/dL — ABNORMAL HIGH (ref 0.57–1.00)
Globulin, Total: 2.4 g/dL (ref 1.5–4.5)
Glucose: 95 mg/dL (ref 70–99)
Potassium: 4.7 mmol/L (ref 3.5–5.2)
Sodium: 138 mmol/L (ref 134–144)
Total Protein: 6.7 g/dL (ref 6.0–8.5)
eGFR: 65 mL/min/1.73 (ref >59)

## 2024-02-09 LAB — HEPATITIS B SURFACE ANTIBODY, QUANTITATIVE: Hepatitis B Surf Ab Quant: <3.5 m[IU]/mL — AB

## 2024-02-10 ENCOUNTER — Ambulatory Visit: Payer: Self-pay | Admitting: Family
